# Patient Record
Sex: Female | Born: 1945 | Race: White | Hispanic: No | Smoking: Never smoker
Health system: Southern US, Community
[De-identification: ages and names within clinical notes are randomized; demographics above are authoritative.]

## PROBLEM LIST (undated history)

## (undated) DIAGNOSIS — C4490 Unspecified malignant neoplasm of skin, unspecified: Secondary | ICD-10-CM

## (undated) DIAGNOSIS — N281 Cyst of kidney, acquired: Secondary | ICD-10-CM

## (undated) DIAGNOSIS — G4733 Obstructive sleep apnea (adult) (pediatric): Secondary | ICD-10-CM

## (undated) DIAGNOSIS — R112 Nausea with vomiting, unspecified: Secondary | ICD-10-CM

## (undated) DIAGNOSIS — T8859XA Other complications of anesthesia, initial encounter: Secondary | ICD-10-CM

## (undated) DIAGNOSIS — T4145XA Adverse effect of unspecified anesthetic, initial encounter: Secondary | ICD-10-CM

## (undated) DIAGNOSIS — E039 Hypothyroidism, unspecified: Secondary | ICD-10-CM

## (undated) DIAGNOSIS — I639 Cerebral infarction, unspecified: Secondary | ICD-10-CM

## (undated) DIAGNOSIS — J189 Pneumonia, unspecified organism: Secondary | ICD-10-CM

## (undated) DIAGNOSIS — K219 Gastro-esophageal reflux disease without esophagitis: Secondary | ICD-10-CM

## (undated) DIAGNOSIS — Z9889 Other specified postprocedural states: Secondary | ICD-10-CM

## (undated) DIAGNOSIS — E7800 Pure hypercholesterolemia, unspecified: Secondary | ICD-10-CM

## (undated) DIAGNOSIS — E119 Type 2 diabetes mellitus without complications: Secondary | ICD-10-CM

## (undated) DIAGNOSIS — I10 Essential (primary) hypertension: Secondary | ICD-10-CM

## (undated) HISTORY — PX: OTHER SURGICAL HISTORY: SHX169

## (undated) HISTORY — DX: Essential (primary) hypertension: I10

## (undated) HISTORY — PX: BREAST CYST ASPIRATION: SHX578

## (undated) HISTORY — DX: Pure hypercholesterolemia, unspecified: E78.00

## (undated) HISTORY — DX: Unspecified malignant neoplasm of skin, unspecified: C44.90

## (undated) HISTORY — DX: Obstructive sleep apnea (adult) (pediatric): G47.33

## (undated) HISTORY — DX: Type 2 diabetes mellitus without complications: E11.9

## (undated) HISTORY — PX: BUNIONECTOMY: SHX129

## (undated) HISTORY — DX: Cerebral infarction, unspecified: I63.9

## (undated) HISTORY — DX: Hypothyroidism, unspecified: E03.9

## (undated) HISTORY — PX: ABDOMINAL HYSTERECTOMY: SUR658

## (undated) HISTORY — DX: Gastro-esophageal reflux disease without esophagitis: K21.9

---

## 2012-10-28 ENCOUNTER — Ambulatory Visit: Payer: Medicare Other

## 2014-01-12 DIAGNOSIS — L648 Other androgenic alopecia: Secondary | ICD-10-CM | POA: Diagnosis not present

## 2014-01-12 DIAGNOSIS — L658 Other specified nonscarring hair loss: Secondary | ICD-10-CM | POA: Diagnosis not present

## 2014-01-12 DIAGNOSIS — D485 Neoplasm of uncertain behavior of skin: Secondary | ICD-10-CM | POA: Diagnosis not present

## 2014-01-12 DIAGNOSIS — L650 Telogen effluvium: Secondary | ICD-10-CM | POA: Diagnosis not present

## 2014-01-12 DIAGNOSIS — D225 Melanocytic nevi of trunk: Secondary | ICD-10-CM | POA: Diagnosis not present

## 2014-01-12 DIAGNOSIS — Z85828 Personal history of other malignant neoplasm of skin: Secondary | ICD-10-CM | POA: Diagnosis not present

## 2014-01-25 DIAGNOSIS — I781 Nevus, non-neoplastic: Secondary | ICD-10-CM | POA: Diagnosis not present

## 2014-01-25 DIAGNOSIS — L821 Other seborrheic keratosis: Secondary | ICD-10-CM | POA: Diagnosis not present

## 2014-01-25 DIAGNOSIS — Z85828 Personal history of other malignant neoplasm of skin: Secondary | ICD-10-CM | POA: Diagnosis not present

## 2014-01-25 DIAGNOSIS — D1801 Hemangioma of skin and subcutaneous tissue: Secondary | ICD-10-CM | POA: Diagnosis not present

## 2014-03-03 DIAGNOSIS — K7581 Nonalcoholic steatohepatitis (NASH): Secondary | ICD-10-CM | POA: Diagnosis not present

## 2014-03-03 DIAGNOSIS — E119 Type 2 diabetes mellitus without complications: Secondary | ICD-10-CM | POA: Diagnosis not present

## 2014-03-03 DIAGNOSIS — R5383 Other fatigue: Secondary | ICD-10-CM | POA: Diagnosis not present

## 2014-03-03 DIAGNOSIS — E785 Hyperlipidemia, unspecified: Secondary | ICD-10-CM | POA: Diagnosis not present

## 2014-03-04 DIAGNOSIS — E559 Vitamin D deficiency, unspecified: Secondary | ICD-10-CM | POA: Diagnosis not present

## 2014-03-04 DIAGNOSIS — D649 Anemia, unspecified: Secondary | ICD-10-CM | POA: Diagnosis not present

## 2014-03-04 DIAGNOSIS — M1990 Unspecified osteoarthritis, unspecified site: Secondary | ICD-10-CM | POA: Diagnosis not present

## 2014-03-04 DIAGNOSIS — E230 Hypopituitarism: Secondary | ICD-10-CM | POA: Diagnosis not present

## 2014-04-07 DIAGNOSIS — K219 Gastro-esophageal reflux disease without esophagitis: Secondary | ICD-10-CM | POA: Diagnosis not present

## 2014-04-07 DIAGNOSIS — I10 Essential (primary) hypertension: Secondary | ICD-10-CM | POA: Diagnosis not present

## 2014-04-07 DIAGNOSIS — E559 Vitamin D deficiency, unspecified: Secondary | ICD-10-CM | POA: Diagnosis not present

## 2014-04-07 DIAGNOSIS — E039 Hypothyroidism, unspecified: Secondary | ICD-10-CM | POA: Diagnosis not present

## 2014-04-07 DIAGNOSIS — Z1389 Encounter for screening for other disorder: Secondary | ICD-10-CM | POA: Diagnosis not present

## 2014-04-07 DIAGNOSIS — Z0000 Encounter for general adult medical examination without abnormal findings: Secondary | ICD-10-CM | POA: Diagnosis not present

## 2014-04-07 DIAGNOSIS — E785 Hyperlipidemia, unspecified: Secondary | ICD-10-CM | POA: Diagnosis not present

## 2014-04-07 DIAGNOSIS — R7309 Other abnormal glucose: Secondary | ICD-10-CM | POA: Diagnosis not present

## 2014-07-26 DIAGNOSIS — I781 Nevus, non-neoplastic: Secondary | ICD-10-CM | POA: Diagnosis not present

## 2014-07-26 DIAGNOSIS — L648 Other androgenic alopecia: Secondary | ICD-10-CM | POA: Diagnosis not present

## 2014-07-26 DIAGNOSIS — Z85828 Personal history of other malignant neoplasm of skin: Secondary | ICD-10-CM | POA: Diagnosis not present

## 2014-07-26 DIAGNOSIS — L910 Hypertrophic scar: Secondary | ICD-10-CM | POA: Diagnosis not present

## 2014-08-11 DIAGNOSIS — J209 Acute bronchitis, unspecified: Secondary | ICD-10-CM | POA: Diagnosis not present

## 2014-08-11 DIAGNOSIS — J019 Acute sinusitis, unspecified: Secondary | ICD-10-CM | POA: Diagnosis not present

## 2014-10-20 DIAGNOSIS — K7581 Nonalcoholic steatohepatitis (NASH): Secondary | ICD-10-CM | POA: Diagnosis not present

## 2014-10-20 DIAGNOSIS — Z6838 Body mass index (BMI) 38.0-38.9, adult: Secondary | ICD-10-CM | POA: Diagnosis not present

## 2014-10-20 DIAGNOSIS — E8881 Metabolic syndrome: Secondary | ICD-10-CM | POA: Diagnosis not present

## 2014-10-20 DIAGNOSIS — E559 Vitamin D deficiency, unspecified: Secondary | ICD-10-CM | POA: Diagnosis not present

## 2014-10-20 DIAGNOSIS — E039 Hypothyroidism, unspecified: Secondary | ICD-10-CM | POA: Diagnosis not present

## 2014-10-20 DIAGNOSIS — E669 Obesity, unspecified: Secondary | ICD-10-CM | POA: Diagnosis not present

## 2014-10-20 DIAGNOSIS — I10 Essential (primary) hypertension: Secondary | ICD-10-CM | POA: Diagnosis not present

## 2014-10-20 DIAGNOSIS — Z23 Encounter for immunization: Secondary | ICD-10-CM | POA: Diagnosis not present

## 2014-10-20 DIAGNOSIS — D649 Anemia, unspecified: Secondary | ICD-10-CM | POA: Diagnosis not present

## 2014-10-25 DIAGNOSIS — K7581 Nonalcoholic steatohepatitis (NASH): Secondary | ICD-10-CM | POA: Diagnosis not present

## 2014-10-25 DIAGNOSIS — I10 Essential (primary) hypertension: Secondary | ICD-10-CM | POA: Diagnosis not present

## 2014-10-25 DIAGNOSIS — E559 Vitamin D deficiency, unspecified: Secondary | ICD-10-CM | POA: Diagnosis not present

## 2014-10-25 DIAGNOSIS — E039 Hypothyroidism, unspecified: Secondary | ICD-10-CM | POA: Diagnosis not present

## 2014-10-25 DIAGNOSIS — D649 Anemia, unspecified: Secondary | ICD-10-CM | POA: Diagnosis not present

## 2014-11-10 DIAGNOSIS — J069 Acute upper respiratory infection, unspecified: Secondary | ICD-10-CM | POA: Diagnosis not present

## 2014-11-10 DIAGNOSIS — J019 Acute sinusitis, unspecified: Secondary | ICD-10-CM | POA: Diagnosis not present

## 2015-01-20 DIAGNOSIS — J029 Acute pharyngitis, unspecified: Secondary | ICD-10-CM | POA: Diagnosis not present

## 2015-01-20 DIAGNOSIS — Z760 Encounter for issue of repeat prescription: Secondary | ICD-10-CM | POA: Diagnosis not present

## 2015-01-26 DIAGNOSIS — R05 Cough: Secondary | ICD-10-CM | POA: Diagnosis not present

## 2015-01-26 DIAGNOSIS — H6693 Otitis media, unspecified, bilateral: Secondary | ICD-10-CM | POA: Diagnosis not present

## 2015-01-26 DIAGNOSIS — J018 Other acute sinusitis: Secondary | ICD-10-CM | POA: Diagnosis not present

## 2015-01-26 DIAGNOSIS — H109 Unspecified conjunctivitis: Secondary | ICD-10-CM | POA: Diagnosis not present

## 2015-02-06 DIAGNOSIS — K219 Gastro-esophageal reflux disease without esophagitis: Secondary | ICD-10-CM | POA: Diagnosis not present

## 2015-02-06 DIAGNOSIS — K7581 Nonalcoholic steatohepatitis (NASH): Secondary | ICD-10-CM | POA: Diagnosis not present

## 2015-02-06 DIAGNOSIS — E039 Hypothyroidism, unspecified: Secondary | ICD-10-CM | POA: Diagnosis not present

## 2015-02-06 DIAGNOSIS — I10 Essential (primary) hypertension: Secondary | ICD-10-CM | POA: Diagnosis not present

## 2015-02-06 DIAGNOSIS — R7309 Other abnormal glucose: Secondary | ICD-10-CM | POA: Diagnosis not present

## 2015-02-06 DIAGNOSIS — E785 Hyperlipidemia, unspecified: Secondary | ICD-10-CM | POA: Diagnosis not present

## 2015-02-06 DIAGNOSIS — E559 Vitamin D deficiency, unspecified: Secondary | ICD-10-CM | POA: Diagnosis not present

## 2015-02-13 DIAGNOSIS — R05 Cough: Secondary | ICD-10-CM | POA: Diagnosis not present

## 2015-02-13 DIAGNOSIS — G4730 Sleep apnea, unspecified: Secondary | ICD-10-CM | POA: Diagnosis not present

## 2015-02-13 DIAGNOSIS — J209 Acute bronchitis, unspecified: Secondary | ICD-10-CM | POA: Diagnosis not present

## 2015-02-13 DIAGNOSIS — R0602 Shortness of breath: Secondary | ICD-10-CM | POA: Diagnosis not present

## 2015-02-20 DIAGNOSIS — J209 Acute bronchitis, unspecified: Secondary | ICD-10-CM | POA: Diagnosis not present

## 2015-05-02 DIAGNOSIS — R0602 Shortness of breath: Secondary | ICD-10-CM | POA: Diagnosis not present

## 2015-05-02 DIAGNOSIS — J209 Acute bronchitis, unspecified: Secondary | ICD-10-CM | POA: Diagnosis not present

## 2015-05-09 DIAGNOSIS — K219 Gastro-esophageal reflux disease without esophagitis: Secondary | ICD-10-CM | POA: Diagnosis not present

## 2015-05-30 DIAGNOSIS — K219 Gastro-esophageal reflux disease without esophagitis: Secondary | ICD-10-CM | POA: Diagnosis not present

## 2015-07-14 ENCOUNTER — Ambulatory Visit (INDEPENDENT_AMBULATORY_CARE_PROVIDER_SITE_OTHER): Payer: Medicare Other | Admitting: Pulmonary Disease

## 2015-07-14 ENCOUNTER — Other Ambulatory Visit (INDEPENDENT_AMBULATORY_CARE_PROVIDER_SITE_OTHER): Payer: Medicare Other

## 2015-07-14 ENCOUNTER — Ambulatory Visit (INDEPENDENT_AMBULATORY_CARE_PROVIDER_SITE_OTHER)
Admission: RE | Admit: 2015-07-14 | Discharge: 2015-07-14 | Disposition: A | Discharge status: Home / Self Care | Payer: Medicare Other | Source: Ambulatory Visit | Attending: Pulmonary Disease | Admitting: Pulmonary Disease

## 2015-07-14 ENCOUNTER — Encounter (INDEPENDENT_AMBULATORY_CARE_PROVIDER_SITE_OTHER): Payer: Self-pay

## 2015-07-14 ENCOUNTER — Encounter: Payer: Self-pay | Admitting: Pulmonary Disease

## 2015-07-14 VITALS — BP 138/72 | HR 74 | Ht 59.0 in | Wt 197.4 lb

## 2015-07-14 DIAGNOSIS — R0600 Dyspnea, unspecified: Secondary | ICD-10-CM

## 2015-07-14 DIAGNOSIS — R0602 Shortness of breath: Secondary | ICD-10-CM | POA: Diagnosis not present

## 2015-07-14 LAB — CBC WITH DIFFERENTIAL/PLATELET
BASOS PCT: 0.7 % (ref 0.0–3.0)
Basophils Absolute: 0 10*3/uL (ref 0.0–0.1)
EOS ABS: 0.4 10*3/uL (ref 0.0–0.7)
EOS PCT: 5.6 % — AB (ref 0.0–5.0)
HEMATOCRIT: 40.9 % (ref 36.0–46.0)
HEMOGLOBIN: 13.6 g/dL (ref 12.0–15.0)
LYMPHS PCT: 36.8 % (ref 12.0–46.0)
Lymphs Abs: 2.6 10*3/uL (ref 0.7–4.0)
MCHC: 33.3 g/dL (ref 30.0–36.0)
MCV: 90.7 fl (ref 78.0–100.0)
MONO ABS: 0.5 10*3/uL (ref 0.1–1.0)
Monocytes Relative: 6.5 % (ref 3.0–12.0)
NEUTROS ABS: 3.5 10*3/uL (ref 1.4–7.7)
NEUTROS PCT: 50.4 % (ref 43.0–77.0)
Platelets: 186 10*3/uL (ref 150.0–400.0)
RBC: 4.51 Mil/uL (ref 3.87–5.11)
RDW: 14.4 % (ref 11.5–15.5)
WBC: 7 10*3/uL (ref 4.0–10.5)

## 2015-07-14 NOTE — Patient Instructions (Addendum)
We will order a lung function test, chest x-ray and CBC If these are normal then we will order a stress test We'll see you back in 4 weeks or sooner if needed

## 2015-07-14 NOTE — Assessment & Plan Note (Signed)
I splinted her today that the differential diagnosis of dyspnea is broad. Fortunately for her there is very few objective abnormalities on my assessment. Her lungs are clear, her oxygenation is normal, and her chest x-ray images which I personally reviewed showed no significant evidence of lung disease.  I think that the most likely cause of her dyspnea is going to be obesity as she's gained 60 pounds in the last year. However, she is at increased risk for coronary disease, she's had a history of GI bleeds or anemia is also possible, and she has not formally been assessed for lung disease.  Plan: PFT Chest x-ray CBC A physical normal then we'll get a stress test If all those tests are normal but I'm going to encourage an aggressive weight loss routine with diet and exercise.

## 2015-07-14 NOTE — Progress Notes (Signed)
Subjective:    Patient ID: Jillian Jimenez, female    DOB: [birth year], 70 y.o.   MRN: [id number]  HPI  Chief Complaint  Patient presents with  . Advice Only    self referral. C/o SOB w/ exertion. Denies any wheezing/chest tightness/cough.    H[place]ley had pneumonia back in the winter time.  She was treated with prednisone, breathing treatments, and antibiotics.  She has not really been breathing well since then.   She has primarily exertional dyspnea. It is not worse with lying down.  It is worse when she lies down flat and is worse in the heat.   The dyspnea has actually improved slightly since the pneumonia in the end of January when she had pneumonia.  She has not really tried to exercise since the episode of pneumonia.  She will feel short of breaht with walking on level ground alone a few feet.  She feels like she can't get a deep breath when she is dyspnic.  NO wheeze, cough, pain.  She has had a slight amount of leg swelling.   She notes that she has gained 50-60 pounds in the last year which she attributes to stress.  She notes that she had a GI bleed and was hospitalized.  She had an endoscopy which was non-diagnostic.  This all occurred in 2015, no recurrence since then.  She was on a baby aspirin at the time.    She has never smoked.  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    She denies cough lately.    She struggled with allergies as a child.  She notes some post nasal drip when she goes outside and when she is walking around.    She has no PCP.    Past Medical History  Diagnosis Date  . Hypertension   . Diabetes mellitus ([place])   . High cholesterol   . Skin cancer   . OSA (obstructive sleep apnea)     on CPAP x 15 years  . Hypothyroid   . 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 Acid reflux      Family History  Problem Relation Age of Onset  . Breast cancer Sister   . Breast cancer Maternal Grandmother   . Stomach cancer Mother   .  Stomach cancer Maternal Grandmother   . COPD Sister   . Asthma Sister      Social History   Social History  . Marital Status: Divorced    Spouse Name: N/A  . Number of Children: Y  . Years of Education: N/A   Occupational History  . retired    Social History Main Topics  . Smoking status: Never Smoker   . Smokeless tobacco: Not on file  . Alcohol Use: 0.0 oz/week    0 Standard drinks or equivalent per week     Comment: 1-2 glasses a day  . Drug Use: No  . Sexual Activity: Not on file   Other Topics Concern  . Not on file   Social History Narrative  . No narrative on file     Allergies  Allergen Reactions  . Augmentin [Amoxicillin-Pot Clavulanate]     Joint aches  . Clindamycin Nausea Only  . Clopidogrel Hives  . Latex   . Norvasc [Amlodipine]     rash  . Promethazine     vomiting  . Sulfa Antibiotics     Sick to stomach  .  Cephalexin Rash     No outpatient prescriptions prior to visit.   No facility-administered medications prior to visit.      Review of Systems  Constitutional: Negative for fever and unexpected weight change.  HENT: Negative for congestion, dental problem, ear pain, nosebleeds, postnasal drip, rhinorrhea, sinus pressure, sneezing, sore throat and trouble swallowing.   Eyes: Negative for redness and itching.  Respiratory: Positive for shortness of breath. Negative for cough, chest tightness and wheezing.   Cardiovascular: Negative for palpitations and leg swelling.  Gastrointestinal: Negative for nausea and vomiting.  Genitourinary: Negative for dysuria.  Musculoskeletal: Negative for joint swelling.  Skin: Negative for rash.  Neurological: Negative for headaches.  Hematological: Does not bruise/bleed easily.  Psychiatric/Behavioral: Negative for dysphoric mood. The patient is not nervous/anxious.        Objective:   Physical Exam Filed Vitals:   07/14/15 1437  BP: 138/72  Pulse: 74  Height: 4\' 11"  (1.499 m)  Weight: 197  lb 6.4 oz (89.54 kg)  SpO2: 99%   RA  Gen: obese but well appearing, no acute distress HENT: NCAT, OP clear, neck supple without masses Eyes: PERRL, EOMi Lymph: no cervical lymphadenopathy PULM: CTA B CV: RRR, no mgr, no JVD GI: BS+, soft, nontender, no hsm Derm: no rash or skin breakdown MSK: normal bulk and tone Neuro: A&Ox4, CN II-XII intact, strength 5/5 in all 4 extremities Psyche: normal mood and affect  Chest x-ray from January 2017 images personally reviewed showing normal pulmonary parenchyma without clear abnormality      Assessment & Plan:  Dyspnea I splinted her today that the differential diagnosis of dyspnea is broad. Fortunately for her there is very few objective abnormalities on my assessment. Her lungs are clear, her oxygenation is normal, and her chest x-ray images which I personally reviewed showed no significant evidence of lung disease.  I think that the most likely cause of her dyspnea is going to be obesity as she's gained 60 pounds in the last year. However, she is at increased risk for coronary disease, she's had a history of GI bleeds or anemia is also possible, and she has not formally been assessed for lung disease.  Plan: PFT Chest x-ray CBC A physical normal then we'll get a stress test If all those tests are normal but I'm going to encourage an aggressive weight loss routine with diet and exercise.     Current outpatient prescriptions:  .  benazepril (LOTENSIN) 10 MG tablet, Take 10 mg by mouth daily., Disp: , Rfl:  .  cetirizine (ZYRTEC) 10 MG tablet, Take 10 mg by mouth daily., Disp: , Rfl:  .  lansoprazole (PREVACID) 30 MG capsule, Take 30 mg by mouth daily at 12 noon., Disp: , Rfl:  .  levothyroxine (SYNTHROID, LEVOTHROID) 25 MCG tablet, Take 25 mcg by mouth daily before breakfast., Disp: , Rfl:  .  pioglitazone (ACTOS) 15 MG tablet, Take 15 mg by mouth daily., Disp: , Rfl: '

## 2015-07-25 ENCOUNTER — Ambulatory Visit (HOSPITAL_COMMUNITY)
Admission: RE | Admit: 2015-07-25 | Discharge: 2015-07-25 | Disposition: A | Discharge status: Home / Self Care | Payer: Medicare Other | Source: Ambulatory Visit | Attending: Pulmonary Disease | Admitting: Pulmonary Disease

## 2015-07-25 DIAGNOSIS — R0600 Dyspnea, unspecified: Secondary | ICD-10-CM | POA: Insufficient documentation

## 2015-07-25 LAB — PULMONARY FUNCTION TEST
DL/VA % PRED: 96 %
DL/VA: 3.96 ml/min/mmHg/L
DLCO COR % PRED: 76 %
DLCO UNC % PRED: 76 %
DLCO UNC: 13.45 ml/min/mmHg
DLCO cor: 13.37 ml/min/mmHg
FEF 25-75 POST: 1.75 L/s
FEF 25-75 Pre: 1.83 L/sec
FEF2575-%CHANGE-POST: -4 %
FEF2575-%PRED-POST: 101 %
FEF2575-%Pred-Pre: 106 %
FEV1-%CHANGE-POST: 0 %
FEV1-%PRED-POST: 97 %
FEV1-%Pred-Pre: 97 %
FEV1-PRE: 1.83 L
FEV1-Post: 1.82 L
FEV1FVC-%Change-Post: 0 %
FEV1FVC-%PRED-PRE: 104 %
FEV6-%Change-Post: 1 %
FEV6-%Pred-Post: 96 %
FEV6-%Pred-Pre: 95 %
FEV6-POST: 2.29 L
FEV6-Pre: 2.27 L
FEV6FVC-%PRED-POST: 104 %
FEV6FVC-%Pred-Pre: 104 %
FVC-%CHANGE-POST: 0 %
FVC-%PRED-PRE: 92 %
FVC-%Pred-Post: 92 %
FVC-POST: 2.29 L
FVC-PRE: 2.3 L
POST FEV1/FVC RATIO: 79 %
PRE FEV1/FVC RATIO: 79 %
PRE FEV6/FVC RATIO: 100 %
Post FEV6/FVC ratio: 100 %
RV % PRED: 71 %
RV: 1.35 L
TLC % pred: 89 %
TLC: 3.87 L

## 2015-07-25 MED ORDER — ALBUTEROL SULFATE (2.5 MG/3ML) 0.083% IN NEBU
2.5000 mg | INHALATION_SOLUTION | Freq: Once | RESPIRATORY_TRACT | Status: AC
  Administered 2015-07-25: 2.5 mg via RESPIRATORY_TRACT

## 2015-07-28 NOTE — Progress Notes (Signed)
OK 

## 2015-08-07 DIAGNOSIS — H8109 Meniere's disease, unspecified ear: Secondary | ICD-10-CM | POA: Diagnosis not present

## 2015-08-07 DIAGNOSIS — M25512 Pain in left shoulder: Secondary | ICD-10-CM | POA: Diagnosis not present

## 2015-08-07 DIAGNOSIS — K7581 Nonalcoholic steatohepatitis (NASH): Secondary | ICD-10-CM | POA: Diagnosis not present

## 2015-08-07 DIAGNOSIS — E559 Vitamin D deficiency, unspecified: Secondary | ICD-10-CM | POA: Diagnosis not present

## 2015-08-07 DIAGNOSIS — Z8679 Personal history of other diseases of the circulatory system: Secondary | ICD-10-CM | POA: Diagnosis not present

## 2015-08-07 DIAGNOSIS — G4733 Obstructive sleep apnea (adult) (pediatric): Secondary | ICD-10-CM | POA: Diagnosis not present

## 2015-08-07 DIAGNOSIS — E039 Hypothyroidism, unspecified: Secondary | ICD-10-CM | POA: Diagnosis not present

## 2015-08-07 DIAGNOSIS — K219 Gastro-esophageal reflux disease without esophagitis: Secondary | ICD-10-CM | POA: Diagnosis not present

## 2015-08-07 DIAGNOSIS — Z85828 Personal history of other malignant neoplasm of skin: Secondary | ICD-10-CM | POA: Diagnosis not present

## 2015-08-07 DIAGNOSIS — E119 Type 2 diabetes mellitus without complications: Secondary | ICD-10-CM | POA: Diagnosis not present

## 2015-08-11 ENCOUNTER — Ambulatory Visit: Payer: Federal, State, Local not specified - PPO | Admitting: Adult Health

## 2015-08-18 DIAGNOSIS — M67911 Unspecified disorder of synovium and tendon, right shoulder: Secondary | ICD-10-CM | POA: Diagnosis not present

## 2015-08-22 ENCOUNTER — Encounter: Payer: Self-pay | Admitting: Adult Health

## 2015-08-22 ENCOUNTER — Ambulatory Visit (INDEPENDENT_AMBULATORY_CARE_PROVIDER_SITE_OTHER): Payer: Medicare Other | Admitting: Adult Health

## 2015-08-22 DIAGNOSIS — R0600 Dyspnea, unspecified: Secondary | ICD-10-CM

## 2015-08-22 NOTE — Patient Instructions (Addendum)
Follow up with our office As needed   Follow up with Cardiology as discussed -Call [phone].

## 2015-08-22 NOTE — Progress Notes (Signed)
Subjective:    Patient ID: Jillian Jimenez, female    DOB: August 03, 1945, 70 y.o.   MRN: [id number]  HPI 70 yo female never smoker seen for pulmonary consult 07/14/15 for dyspnea/DOE .   08/22/2015 Follow up : DOE Pt returns for a 1 month follow up . Seen for pulmonary consult last month for DOE for ~6 months . Developed PNA 01/2015 , tx w. Abx. Since then had DOE . She also gained about 50lbs over last year. Felt due to stress.  Chest xray was normal. PFT was normal with FEV1 97%, ratio 79, FVC 92%. No sign BD response. DLCO 76%. CBC was normal . She says she is feeling better. Dyspnea has resolved. Feels she is back to normal .  Denies dyspnea or DOE . She denies chest pain, exertional chest pain, jaw pain, syncope.  She has HTN and DM. We discussed that if dyspnea returns could consider cardiac workup as she has never had an evaluation in past. She does have some risk factors.   Past Medical History:  Diagnosis Date  . Acid reflux   . Diabetes mellitus ([place])   . High cholesterol   . Hypertension   . Hypothyroid   . OSA (obstructive sleep apnea)    on CPAP x 15 years  . Skin cancer   . Stroke [facility])    in eye   Current Outpatient Prescriptions on File Prior to Visit  Medication Sig Dispense Refill  . benazepril (LOTENSIN) 10 MG tablet Take 10 mg by mouth daily.    . cetirizine (ZYRTEC) 10 MG tablet Take 10 mg by mouth daily.    . lansoprazole (PREVACID) 30 MG capsule Take 30 mg by mouth daily at 12 noon.    Marland Kitchen levothyroxine (SYNTHROID, LEVOTHROID) 25 MCG tablet Take 25 mcg by mouth daily before breakfast.    . pioglitazone (ACTOS) 15 MG tablet Take 15 mg by mouth daily.     No current facility-administered medications on file prior to visit.       Review of Systems Constitutional:   No  weight loss, night sweats,  Fevers, chills +, fatigue, or  lassitude.  HEENT:   No headaches,  Difficulty swallowing,  Tooth/dental problems, or  Sore throat,                No sneezing, itching,  ear ache, nasal congestion, post nasal drip,   CV:  No chest pain,  Orthopnea, PND, swelling in lower extremities, anasarca, dizziness, palpitations, syncope.   GI  No heartburn, indigestion, abdominal pain, nausea, vomiting, diarrhea, change in bowel habits, loss of appetite, bloody stools.   Resp:    No chest wall deformity  Skin: no rash or lesions.  GU: no dysuria, change in color of urine, no urgency or frequency.  No flank pain, no hematuria   MS:  No joint pain or swelling.  No decreased range of motion.  No back pain.  Psych:  No change in mood or affect. No depression or anxiety.  No memory loss.         Objective:   Physical Exam  Vitals:   08/22/15 1530  BP: 126/70  Pulse: 69  Temp: 98.1 F (36.7 C)  TempSrc: Oral  SpO2: 96%  Weight: 193 lb (87.5 kg)  Height: 4\' 11"  (1.499 m)    GEN: A/Ox3; pleasant , NAD, elderly , obese    HEENT:  [place]/AT,  EACs-clear, TMs-wnl, NOSE-clear, THROAT-clear, no lesions, no postnasal drip or exudate noted.  NECK:  Supple w/ fair ROM; no JVD; normal carotid impulses w/o bruits; no thyromegaly or nodules palpated; no lymphadenopathy.    RESP  Clear  P & A; w/o, wheezes/ rales/ or rhonchi. no accessory muscle use, no dullness to percussion  CARD:  RRR, no m/r/g  , no peripheral edema, pulses intact, no cyanosis or clubbing.  GI:   Soft & nt; nml bowel sounds; no organomegaly or masses detected.   Musco: Warm bil, no deformities or joint swelling noted.   Neuro: alert, no focal deficits noted.    Skin: Warm, no lesions or rashes  Shirin Echeverry NP-C  [facility]  08/22/2015

## 2015-08-22 NOTE — Assessment & Plan Note (Signed)
Resolved  follow up As needed

## 2015-08-23 DIAGNOSIS — M7542 Impingement syndrome of left shoulder: Secondary | ICD-10-CM | POA: Diagnosis not present

## 2015-08-23 DIAGNOSIS — M25512 Pain in left shoulder: Secondary | ICD-10-CM | POA: Diagnosis not present

## 2015-08-24 ENCOUNTER — Encounter: Payer: Self-pay | Admitting: Adult Health

## 2015-08-25 DIAGNOSIS — M25512 Pain in left shoulder: Secondary | ICD-10-CM | POA: Diagnosis not present

## 2015-08-25 DIAGNOSIS — M7542 Impingement syndrome of left shoulder: Secondary | ICD-10-CM | POA: Diagnosis not present

## 2015-08-28 DIAGNOSIS — M25512 Pain in left shoulder: Secondary | ICD-10-CM | POA: Diagnosis not present

## 2015-08-28 NOTE — Progress Notes (Signed)
Reviewed, I agree with this plan of care 

## 2015-08-31 DIAGNOSIS — M7542 Impingement syndrome of left shoulder: Secondary | ICD-10-CM | POA: Diagnosis not present

## 2015-08-31 DIAGNOSIS — M25512 Pain in left shoulder: Secondary | ICD-10-CM | POA: Diagnosis not present

## 2015-09-05 DIAGNOSIS — M25512 Pain in left shoulder: Secondary | ICD-10-CM | POA: Diagnosis not present

## 2015-09-05 DIAGNOSIS — M7542 Impingement syndrome of left shoulder: Secondary | ICD-10-CM | POA: Diagnosis not present

## 2015-09-07 DIAGNOSIS — M7542 Impingement syndrome of left shoulder: Secondary | ICD-10-CM | POA: Diagnosis not present

## 2015-09-07 DIAGNOSIS — M25512 Pain in left shoulder: Secondary | ICD-10-CM | POA: Diagnosis not present

## 2015-09-12 DIAGNOSIS — Z1239 Encounter for other screening for malignant neoplasm of breast: Secondary | ICD-10-CM | POA: Diagnosis not present

## 2015-09-12 DIAGNOSIS — G4733 Obstructive sleep apnea (adult) (pediatric): Secondary | ICD-10-CM | POA: Diagnosis not present

## 2015-09-12 DIAGNOSIS — M25512 Pain in left shoulder: Secondary | ICD-10-CM | POA: Diagnosis not present

## 2015-09-12 DIAGNOSIS — K7581 Nonalcoholic steatohepatitis (NASH): Secondary | ICD-10-CM | POA: Diagnosis not present

## 2015-09-12 DIAGNOSIS — E559 Vitamin D deficiency, unspecified: Secondary | ICD-10-CM | POA: Diagnosis not present

## 2015-09-12 DIAGNOSIS — M7542 Impingement syndrome of left shoulder: Secondary | ICD-10-CM | POA: Diagnosis not present

## 2015-09-12 DIAGNOSIS — Z23 Encounter for immunization: Secondary | ICD-10-CM | POA: Diagnosis not present

## 2015-09-12 DIAGNOSIS — E039 Hypothyroidism, unspecified: Secondary | ICD-10-CM | POA: Diagnosis not present

## 2015-09-12 DIAGNOSIS — Z8679 Personal history of other diseases of the circulatory system: Secondary | ICD-10-CM | POA: Diagnosis not present

## 2015-09-12 DIAGNOSIS — Z13220 Encounter for screening for lipoid disorders: Secondary | ICD-10-CM | POA: Diagnosis not present

## 2015-09-12 DIAGNOSIS — E119 Type 2 diabetes mellitus without complications: Secondary | ICD-10-CM | POA: Diagnosis not present

## 2015-09-12 DIAGNOSIS — Z0000 Encounter for general adult medical examination without abnormal findings: Secondary | ICD-10-CM | POA: Diagnosis not present

## 2015-09-12 DIAGNOSIS — M8588 Other specified disorders of bone density and structure, other site: Secondary | ICD-10-CM | POA: Diagnosis not present

## 2015-09-12 DIAGNOSIS — K219 Gastro-esophageal reflux disease without esophagitis: Secondary | ICD-10-CM | POA: Diagnosis not present

## 2015-09-14 DIAGNOSIS — M7542 Impingement syndrome of left shoulder: Secondary | ICD-10-CM | POA: Diagnosis not present

## 2015-09-14 DIAGNOSIS — M25512 Pain in left shoulder: Secondary | ICD-10-CM | POA: Diagnosis not present

## 2015-10-02 DIAGNOSIS — M7542 Impingement syndrome of left shoulder: Secondary | ICD-10-CM | POA: Diagnosis not present

## 2015-10-02 DIAGNOSIS — M25512 Pain in left shoulder: Secondary | ICD-10-CM | POA: Diagnosis not present

## 2015-10-12 DIAGNOSIS — Z803 Family history of malignant neoplasm of breast: Secondary | ICD-10-CM | POA: Diagnosis not present

## 2015-10-12 DIAGNOSIS — M25512 Pain in left shoulder: Secondary | ICD-10-CM | POA: Diagnosis not present

## 2015-10-12 DIAGNOSIS — Z01419 Encounter for gynecological examination (general) (routine) without abnormal findings: Secondary | ICD-10-CM | POA: Diagnosis not present

## 2015-10-12 DIAGNOSIS — M7541 Impingement syndrome of right shoulder: Secondary | ICD-10-CM | POA: Diagnosis not present

## 2015-10-13 DIAGNOSIS — M25512 Pain in left shoulder: Secondary | ICD-10-CM | POA: Diagnosis not present

## 2015-10-17 DIAGNOSIS — D485 Neoplasm of uncertain behavior of skin: Secondary | ICD-10-CM | POA: Diagnosis not present

## 2015-10-17 DIAGNOSIS — D0371 Melanoma in situ of right lower limb, including hip: Secondary | ICD-10-CM | POA: Diagnosis not present

## 2015-10-17 DIAGNOSIS — D225 Melanocytic nevi of trunk: Secondary | ICD-10-CM | POA: Diagnosis not present

## 2015-10-17 DIAGNOSIS — D0359 Melanoma in situ of other part of trunk: Secondary | ICD-10-CM | POA: Diagnosis not present

## 2015-10-18 DIAGNOSIS — J069 Acute upper respiratory infection, unspecified: Secondary | ICD-10-CM | POA: Diagnosis not present

## 2015-10-19 ENCOUNTER — Encounter: Payer: Self-pay | Admitting: Genetic Counselor

## 2015-10-19 ENCOUNTER — Telehealth: Payer: Self-pay | Admitting: Genetic Counselor

## 2015-10-19 NOTE — Telephone Encounter (Signed)
Appt scheduled w/Kayla Boggs for 11/16@1pm . Pt aware to arrive 30 mintes early. Demographics verified. Letter mailed to the pt.

## 2015-10-24 DIAGNOSIS — M25512 Pain in left shoulder: Secondary | ICD-10-CM | POA: Diagnosis not present

## 2015-10-24 DIAGNOSIS — M8588 Other specified disorders of bone density and structure, other site: Secondary | ICD-10-CM | POA: Diagnosis not present

## 2015-10-26 DIAGNOSIS — E559 Vitamin D deficiency, unspecified: Secondary | ICD-10-CM | POA: Diagnosis not present

## 2015-10-26 DIAGNOSIS — E039 Hypothyroidism, unspecified: Secondary | ICD-10-CM | POA: Diagnosis not present

## 2015-10-26 DIAGNOSIS — E8881 Metabolic syndrome: Secondary | ICD-10-CM | POA: Diagnosis not present

## 2015-10-26 DIAGNOSIS — K7581 Nonalcoholic steatohepatitis (NASH): Secondary | ICD-10-CM | POA: Diagnosis not present

## 2015-10-26 DIAGNOSIS — Z6838 Body mass index (BMI) 38.0-38.9, adult: Secondary | ICD-10-CM | POA: Diagnosis not present

## 2015-10-26 DIAGNOSIS — E669 Obesity, unspecified: Secondary | ICD-10-CM | POA: Diagnosis not present

## 2015-10-26 DIAGNOSIS — I10 Essential (primary) hypertension: Secondary | ICD-10-CM | POA: Diagnosis not present

## 2015-11-06 DIAGNOSIS — M75112 Incomplete rotator cuff tear or rupture of left shoulder, not specified as traumatic: Secondary | ICD-10-CM | POA: Diagnosis not present

## 2015-11-08 DIAGNOSIS — L905 Scar conditions and fibrosis of skin: Secondary | ICD-10-CM | POA: Diagnosis not present

## 2015-11-08 DIAGNOSIS — C4371 Malignant melanoma of right lower limb, including hip: Secondary | ICD-10-CM | POA: Diagnosis not present

## 2015-11-14 ENCOUNTER — Telehealth: Payer: Self-pay | Admitting: Genetic Counselor

## 2015-11-14 NOTE — Telephone Encounter (Signed)
11/16 Appointments canceled per patient request. Patient had concerns with lab being covered by insurance after speaking with a representative for her insurance company. The patient requested to cancel both appointment

## 2015-11-23 ENCOUNTER — Other Ambulatory Visit: Payer: Federal, State, Local not specified - PPO

## 2015-11-23 ENCOUNTER — Encounter: Payer: Federal, State, Local not specified - PPO | Admitting: Genetic Counselor

## 2015-11-27 DIAGNOSIS — L900 Lichen sclerosus et atrophicus: Secondary | ICD-10-CM | POA: Diagnosis not present

## 2015-11-27 DIAGNOSIS — D0359 Melanoma in situ of other part of trunk: Secondary | ICD-10-CM | POA: Diagnosis not present

## 2015-12-26 DIAGNOSIS — G4700 Insomnia, unspecified: Secondary | ICD-10-CM | POA: Diagnosis not present

## 2015-12-26 DIAGNOSIS — G4733 Obstructive sleep apnea (adult) (pediatric): Secondary | ICD-10-CM | POA: Diagnosis not present

## 2016-02-06 DIAGNOSIS — L821 Other seborrheic keratosis: Secondary | ICD-10-CM | POA: Diagnosis not present

## 2016-02-06 DIAGNOSIS — Z85820 Personal history of malignant melanoma of skin: Secondary | ICD-10-CM | POA: Diagnosis not present

## 2016-02-06 DIAGNOSIS — D1801 Hemangioma of skin and subcutaneous tissue: Secondary | ICD-10-CM | POA: Diagnosis not present

## 2016-02-06 DIAGNOSIS — L814 Other melanin hyperpigmentation: Secondary | ICD-10-CM | POA: Diagnosis not present

## 2016-02-27 DIAGNOSIS — G4700 Insomnia, unspecified: Secondary | ICD-10-CM | POA: Diagnosis not present

## 2016-02-27 DIAGNOSIS — G4733 Obstructive sleep apnea (adult) (pediatric): Secondary | ICD-10-CM | POA: Diagnosis not present

## 2016-03-21 DIAGNOSIS — S82832A Other fracture of upper and lower end of left fibula, initial encounter for closed fracture: Secondary | ICD-10-CM | POA: Diagnosis not present

## 2016-03-21 DIAGNOSIS — W19XXXA Unspecified fall, initial encounter: Secondary | ICD-10-CM | POA: Diagnosis not present

## 2016-03-21 DIAGNOSIS — S82892A Other fracture of left lower leg, initial encounter for closed fracture: Secondary | ICD-10-CM | POA: Diagnosis not present

## 2016-03-21 DIAGNOSIS — S93492A Sprain of other ligament of left ankle, initial encounter: Secondary | ICD-10-CM | POA: Diagnosis not present

## 2016-03-24 ENCOUNTER — Emergency Department (HOSPITAL_COMMUNITY): Payer: Medicare Other

## 2016-03-24 ENCOUNTER — Encounter (HOSPITAL_COMMUNITY): Payer: Self-pay | Admitting: Emergency Medicine

## 2016-03-24 ENCOUNTER — Inpatient Hospital Stay (HOSPITAL_COMMUNITY)
Admission: EM | Admit: 2016-03-24 | Discharge: 2016-03-28 | DRG: 103 | Disposition: A | Discharge status: Home / Self Care | Payer: Medicare Other | Attending: Internal Medicine | Admitting: Internal Medicine

## 2016-03-24 DIAGNOSIS — K219 Gastro-esophageal reflux disease without esophagitis: Secondary | ICD-10-CM | POA: Diagnosis not present

## 2016-03-24 DIAGNOSIS — S299XXA Unspecified injury of thorax, initial encounter: Secondary | ICD-10-CM | POA: Diagnosis not present

## 2016-03-24 DIAGNOSIS — E1169 Type 2 diabetes mellitus with other specified complication: Secondary | ICD-10-CM | POA: Diagnosis not present

## 2016-03-24 DIAGNOSIS — E039 Hypothyroidism, unspecified: Secondary | ICD-10-CM | POA: Diagnosis not present

## 2016-03-24 DIAGNOSIS — Z79899 Other long term (current) drug therapy: Secondary | ICD-10-CM

## 2016-03-24 DIAGNOSIS — E860 Dehydration: Secondary | ICD-10-CM | POA: Diagnosis present

## 2016-03-24 DIAGNOSIS — Z881 Allergy status to other antibiotic agents status: Secondary | ICD-10-CM

## 2016-03-24 DIAGNOSIS — Z803 Family history of malignant neoplasm of breast: Secondary | ICD-10-CM

## 2016-03-24 DIAGNOSIS — Z882 Allergy status to sulfonamides status: Secondary | ICD-10-CM

## 2016-03-24 DIAGNOSIS — Z800 Family history of malignant neoplasm of digestive organs: Secondary | ICD-10-CM

## 2016-03-24 DIAGNOSIS — Z888 Allergy status to other drugs, medicaments and biological substances status: Secondary | ICD-10-CM

## 2016-03-24 DIAGNOSIS — E7800 Pure hypercholesterolemia, unspecified: Secondary | ICD-10-CM | POA: Diagnosis present

## 2016-03-24 DIAGNOSIS — W08XXXA Fall from other furniture, initial encounter: Secondary | ICD-10-CM | POA: Diagnosis present

## 2016-03-24 DIAGNOSIS — I10 Essential (primary) hypertension: Secondary | ICD-10-CM | POA: Diagnosis present

## 2016-03-24 DIAGNOSIS — Z90710 Acquired absence of both cervix and uterus: Secondary | ICD-10-CM

## 2016-03-24 DIAGNOSIS — S82892A Other fracture of left lower leg, initial encounter for closed fracture: Secondary | ICD-10-CM

## 2016-03-24 DIAGNOSIS — S199XXA Unspecified injury of neck, initial encounter: Secondary | ICD-10-CM | POA: Diagnosis not present

## 2016-03-24 DIAGNOSIS — E11649 Type 2 diabetes mellitus with hypoglycemia without coma: Secondary | ICD-10-CM | POA: Diagnosis present

## 2016-03-24 DIAGNOSIS — Z825 Family history of asthma and other chronic lower respiratory diseases: Secondary | ICD-10-CM

## 2016-03-24 DIAGNOSIS — R112 Nausea with vomiting, unspecified: Secondary | ICD-10-CM | POA: Diagnosis not present

## 2016-03-24 DIAGNOSIS — S060X0A Concussion without loss of consciousness, initial encounter: Secondary | ICD-10-CM

## 2016-03-24 DIAGNOSIS — G8911 Acute pain due to trauma: Secondary | ICD-10-CM | POA: Diagnosis not present

## 2016-03-24 DIAGNOSIS — Z8673 Personal history of transient ischemic attack (TIA), and cerebral infarction without residual deficits: Secondary | ICD-10-CM

## 2016-03-24 DIAGNOSIS — F0781 Postconcussional syndrome: Principal | ICD-10-CM | POA: Diagnosis present

## 2016-03-24 DIAGNOSIS — G44319 Acute post-traumatic headache, not intractable: Secondary | ICD-10-CM | POA: Diagnosis present

## 2016-03-24 DIAGNOSIS — Z91040 Latex allergy status: Secondary | ICD-10-CM

## 2016-03-24 DIAGNOSIS — Z886 Allergy status to analgesic agent status: Secondary | ICD-10-CM

## 2016-03-24 DIAGNOSIS — E669 Obesity, unspecified: Secondary | ICD-10-CM | POA: Diagnosis not present

## 2016-03-24 DIAGNOSIS — Y92019 Unspecified place in single-family (private) house as the place of occurrence of the external cause: Secondary | ICD-10-CM

## 2016-03-24 DIAGNOSIS — Z6838 Body mass index (BMI) 38.0-38.9, adult: Secondary | ICD-10-CM

## 2016-03-24 DIAGNOSIS — Z85828 Personal history of other malignant neoplasm of skin: Secondary | ICD-10-CM

## 2016-03-24 DIAGNOSIS — Y93E9 Activity, other interior property and clothing maintenance: Secondary | ICD-10-CM

## 2016-03-24 DIAGNOSIS — K7581 Nonalcoholic steatohepatitis (NASH): Secondary | ICD-10-CM | POA: Diagnosis not present

## 2016-03-24 DIAGNOSIS — Z7984 Long term (current) use of oral hypoglycemic drugs: Secondary | ICD-10-CM

## 2016-03-24 DIAGNOSIS — S0990XA Unspecified injury of head, initial encounter: Secondary | ICD-10-CM | POA: Diagnosis not present

## 2016-03-24 DIAGNOSIS — R51 Headache: Secondary | ICD-10-CM | POA: Diagnosis not present

## 2016-03-24 DIAGNOSIS — G4733 Obstructive sleep apnea (adult) (pediatric): Secondary | ICD-10-CM | POA: Diagnosis present

## 2016-03-24 LAB — BASIC METABOLIC PANEL
Anion gap: 10 (ref 5–15)
BUN: 15 mg/dL (ref 6–20)
CO2: 21 mmol/L — ABNORMAL LOW (ref 22–32)
CREATININE: 0.73 mg/dL (ref 0.44–1.00)
Calcium: 9.6 mg/dL (ref 8.9–10.3)
Chloride: 112 mmol/L — ABNORMAL HIGH (ref 101–111)
GFR calc Af Amer: >60 mL/min (ref >60)
Glucose, Bld: 112 mg/dL — ABNORMAL HIGH (ref 65–99)
POTASSIUM: 3.4 mmol/L — AB (ref 3.5–5.1)
Sodium: 143 mmol/L (ref 135–145)

## 2016-03-24 LAB — CBC
HCT: 41.3 % (ref 36.0–46.0)
Hemoglobin: 13.7 g/dL (ref 12.0–15.0)
MCH: 29.8 pg (ref 26.0–34.0)
MCHC: 33.2 g/dL (ref 30.0–36.0)
MCV: 89.8 fL (ref 78.0–100.0)
PLATELETS: 173 10*3/uL (ref 150–400)
RBC: 4.6 MIL/uL (ref 3.87–5.11)
RDW: 14.3 % (ref 11.5–15.5)
WBC: 7.9 10*3/uL (ref 4.0–10.5)

## 2016-03-24 LAB — URINALYSIS, ROUTINE W REFLEX MICROSCOPIC
BILIRUBIN URINE: NEGATIVE
GLUCOSE, UA: NEGATIVE mg/dL
HGB URINE DIPSTICK: NEGATIVE
Ketones, ur: NEGATIVE mg/dL
Leukocytes, UA: NEGATIVE
Nitrite: NEGATIVE
PROTEIN: NEGATIVE mg/dL
Specific Gravity, Urine: 1.003 — ABNORMAL LOW (ref 1.005–1.030)
pH: 6 (ref 5.0–8.0)

## 2016-03-24 MED ORDER — LACTATED RINGERS IV BOLUS (SEPSIS): 1000.0000 mL | Freq: Once | INTRAVENOUS | Status: DC

## 2016-03-24 MED ORDER — INSULIN ASPART 100 UNIT/ML ~~LOC~~ SOLN: 0.0000 [IU] | Freq: Three times a day (TID) | SUBCUTANEOUS | Status: DC

## 2016-03-24 MED ORDER — SODIUM CHLORIDE 0.9 % IV BOLUS (SEPSIS)
1000.0000 mL | Freq: Once | INTRAVENOUS | Status: AC
  Administered 2016-03-24: 1000 mL via INTRAVENOUS

## 2016-03-24 MED ORDER — METOCLOPRAMIDE HCL 5 MG/ML IJ SOLN
5.0000 mg | Freq: Once | INTRAMUSCULAR | Status: AC
  Administered 2016-03-24: 5 mg via INTRAVENOUS
  Filled 2016-03-24: qty 2

## 2016-03-24 MED ORDER — ACETAMINOPHEN 650 MG RE SUPP: 650.0000 mg | Freq: Four times a day (QID) | RECTAL | Status: DC | PRN

## 2016-03-24 MED ORDER — POTASSIUM CHLORIDE IN NACL 20-0.45 MEQ/L-% IV SOLN
INTRAVENOUS | Status: AC
  Administered 2016-03-25 (×2): via INTRAVENOUS
  Filled 2016-03-24 (×2): qty 1000

## 2016-03-24 MED ORDER — ACETAMINOPHEN 325 MG PO TABS: 650.0000 mg | ORAL_TABLET | Freq: Four times a day (QID) | ORAL | Status: DC | PRN

## 2016-03-24 MED ORDER — ONDANSETRON HCL 4 MG/2ML IJ SOLN
4.0000 mg | Freq: Once | INTRAMUSCULAR | Status: AC
  Administered 2016-03-24: 4 mg via INTRAVENOUS
  Filled 2016-03-24: qty 2

## 2016-03-24 MED ORDER — LEVOTHYROXINE SODIUM 25 MCG PO TABS
25.0000 ug | ORAL_TABLET | Freq: Every day | ORAL | Status: DC
  Administered 2016-03-25 – 2016-03-28 (×4): 25 ug via ORAL
  Filled 2016-03-24 (×4): qty 1

## 2016-03-24 MED ORDER — KCL IN DEXTROSE-NACL 20-5-0.45 MEQ/L-%-% IV SOLN: Freq: Once | INTRAVENOUS | Status: DC

## 2016-03-24 MED ORDER — HYDRALAZINE HCL 20 MG/ML IJ SOLN: 10.0000 mg | INTRAMUSCULAR | Status: DC | PRN

## 2016-03-24 MED ORDER — DIPHENHYDRAMINE HCL 50 MG/ML IJ SOLN
12.5000 mg | Freq: Once | INTRAMUSCULAR | Status: AC
  Administered 2016-03-24: 12.5 mg via INTRAVENOUS
  Filled 2016-03-24: qty 1

## 2016-03-24 NOTE — ED Triage Notes (Addendum)
Per PTAR, patient fell off of a small dresser 3 days ago Patient hit her head on the wall. Patient fractured left ankle (patient seen at ortho). Patient fell Saturday at restaurant while using crutches. Patient states she hit her head again on the floor & the door closed on her head. Patient complaining of headache/nausea starting yesterday. Reports increased symptoms today. Patient has not taken any pain medications. Patient is from home. Denies blood thinner use.

## 2016-03-24 NOTE — ED Notes (Signed)
Bed: EH63 Expected date:  Expected time:  Means of arrival:  Comments: 26f Headache and N/V fall last Thursday

## 2016-03-24 NOTE — ED Provider Notes (Signed)
[facility] DEPT Provider Note   CSN: [id number] Arrival date & time: 03/24/16  1902     History   Chief Complaint Chief Complaint  Patient presents with  . Fall  . Head Injury    HPI Jillian Jimenez is a 71 y.o. female.  HPI    45-yo F with PMHx as below here with nausea, vomiting after injury. Three days ago, pt was standing on a small dresser to clean when she fell backwards. She struck the back of her head on the drywall and rolled her ankle. No LOC but she was "dazed." Since then, she went to [employer] for her ankle which was broken and splinted. She has since had worsening HA, nausea, vomiting, and has been unable to eat/drink. She has also been more emotional than usual and is having difficulty getting around her house. HA is aching, throbbing, and posterior.  Past Medical History:  Diagnosis Date  . Acid reflux   . Diabetes mellitus ([place])   . High cholesterol   . Hypertension   . Hypothyroid   . OSA (obstructive sleep apnea)    on CPAP x 15 years  . Skin cancer   . Stroke [facility])    in eye    Patient Active Problem List   Diagnosis Date Noted  . Post concussion syndrome 03/24/2016  . Diabetes mellitus type 2 in obese ([place]) 03/24/2016  . Essential hypertension 03/24/2016  . Hypothyroidism 03/24/2016  . Dyspnea 07/14/2015    Past Surgical History:  Procedure Laterality Date  . ABDOMINAL HYSTERECTOMY    . BUNIONECTOMY     x2  . CESAREAN SECTION     x2    OB History    No data available       Home Medications    Prior to Admission medications   Medication Sig Start Date End Date Taking? Authorizing Provider  benazepril (LOTENSIN) 10 MG tablet Take 10 mg by mouth daily.   Yes Historical Provider, MD  cetirizine (ZYRTEC) 10 MG tablet Take 10 mg by mouth daily.   Yes Historical Provider, MD  lansoprazole (PREVACID) 30 MG capsule Take 30 mg by mouth daily at 12 noon.   Yes Historical Provider, MD  levothyroxine (SYNTHROID, LEVOTHROID) 25 MCG  tablet Take 25 mcg by mouth daily before breakfast.   Yes Historical Provider, MD  pioglitazone (ACTOS) 15 MG tablet Take 15 mg by mouth daily.   Yes Historical Provider, MD    Family History Family History  Problem Relation Age of Onset  . Breast cancer Sister   . Breast cancer Maternal Grandmother   . Stomach cancer Maternal Grandmother   . Stomach cancer Mother   . COPD Sister   . Asthma Sister     Social History Social History  Substance Use Topics  . Smoking status: Never Smoker  . Smokeless tobacco: Never Used  . Alcohol use 0.0 oz/week     Comment: 1-2 glasses a day     Allergies   Aspirin; Augmentin [amoxicillin-pot clavulanate]; Clindamycin; Clopidogrel; Latex; Norvasc [amlodipine]; Promethazine; Sulfa antibiotics; and Cephalexin   Review of Systems Review of Systems  Constitutional: Positive for fatigue. Negative for fever.  Gastrointestinal: Positive for nausea and vomiting.  Musculoskeletal: Positive for arthralgias, gait problem and neck pain.  Neurological: Positive for weakness and headaches.  All other systems reviewed and are negative.    Physical Exam Updated Vital Signs BP 129/63   Pulse 92   Resp 18   Ht 4\' 11"  (1.499 m)  Wt 190 lb (86.2 kg)   SpO2 94%   BMI 38.38 kg/m   Physical Exam  Constitutional: She is oriented to person, place, and time. She appears well-developed and well-nourished. No distress.  HENT:  Head: Normocephalic and atraumatic.  Eyes: Conjunctivae are normal.  Neck: Neck supple.  Mild bilateral tenderness to palpation along cervical spine. No bruising or deformity.  Cardiovascular: Normal rate, regular rhythm and normal heart sounds.  Exam reveals no friction rub.   No murmur heard. Pulmonary/Chest: Effort normal and breath sounds normal. No respiratory distress. She has no wheezes. She has no rales.  Abdominal: She exhibits no distension.  Musculoskeletal: She exhibits no edema.  Neurological: She is alert and  oriented to person, place, and time. She exhibits normal muscle tone.  Skin: Skin is warm. Capillary refill takes less than 2 seconds.  Psychiatric: She has a normal mood and affect.  Nursing note and vitals reviewed.   Neurological Exam:  Mental Status: Alert and oriented to person, place, and time. Attention and concentration normal. Speech clear. Recent memory is intact. Cranial Nerves: Visual fields grossly intact. EOMI and PERRLA. No nystagmus noted. Facial sensation intact at forehead, maxillary cheek, and chin/mandible bilaterally. No facial asymmetry or weakness. Hearing grossly normal. Uvula is midline, and palate elevates symmetrically. Normal SCM and trapezius strength. Tongue midline without fasciculations. Motor: Muscle strength 5/5 in proximal and distal UE and LE bilaterally. No pronator drift. Muscle tone normal. Reflexes: 2+ and symmetrical in all four extremities.  Sensation: Intact to light touch in upper and lower extremities distally bilaterally.  Gait: Normal without ataxia. Coordination: Normal FTN bilaterally.     ED Treatments / Results  Labs (all labs ordered are listed, but only abnormal results are displayed) Labs Reviewed  BASIC METABOLIC PANEL - Abnormal; Notable for the following:       Result Value   Potassium 3.4 (*)    Chloride 112 (*)    CO2 21 (*)    Glucose, Bld 112 (*)    All other components within normal limits  URINALYSIS, ROUTINE W REFLEX MICROSCOPIC - Abnormal; Notable for the following:    Color, Urine STRAW (*)    Specific Gravity, Urine 1.003 (*)    All other components within normal limits  CBC  BASIC METABOLIC PANEL  CBC    EKG  EKG Interpretation None       Radiology Dg Chest 2 View  Result Date: 03/24/2016 CLINICAL DATA:  Patient fell 3 days ago and fracture of the left ankle. Patient fell on Saturday while using crutches. Struck head again. Headache and nausea starting yesterday. Symptoms increased today. History of  stroke, hypertension, diabetes EXAM: CHEST  2 VIEW COMPARISON:  07/14/2015 FINDINGS: Shallow inspiration. Normal heart size and pulmonary vascularity. No focal airspace disease or consolidation in the lungs. No blunting of costophrenic angles. No pneumothorax. Mediastinal contours appear intact. Degenerative changes in the spine. IMPRESSION: No active cardiopulmonary disease. Electronically Signed   By: Lucienne Capers M.D.   On: 03/24/2016 21:05   Ct Head Wo Contrast  Result Date: 03/24/2016 CLINICAL DATA:  Fall.  Initial encounter. EXAM: CT HEAD WITHOUT CONTRAST CT CERVICAL SPINE WITHOUT CONTRAST TECHNIQUE: Multidetector CT imaging of the head and cervical spine was performed following the standard protocol without intravenous contrast. Multiplanar CT image reconstructions of the cervical spine were also generated. COMPARISON:  None. FINDINGS: CT HEAD FINDINGS Brain: There is no evidence of acute cortical infarct, intracranial hemorrhage, mass, midline shift, or extra-axial fluid  collection. The ventricles and sulci are within normal limits for age. Vascular: No hyperdense vessel or unexpected calcification. Skull: No fracture or focal osseous lesion. Sinuses/Orbits: Visualized paranasal sinuses and mastoid air cells are clear. Orbits are unremarkable. Other: None. CT CERVICAL SPINE FINDINGS Alignment: Mild reversal the normal cervical lordosis. No subluxation. Skull base and vertebrae: No evidence of acute fracture or destructive osseous process. Soft tissues and spinal canal: No prevertebral fluid or swelling. No visible canal hematoma. Disc levels: Mild disc space narrowing and moderate degenerative osteophyte formation from C4-T1. Mild upper cervical facet arthrosis, with facet spurring at C3-4 resulting in moderate right neural foraminal stenosis. Upper chest: Clear lung apices. Other: None. IMPRESSION: 1. Negative head CT. 2. No evidence of acute cervical spine fracture or subluxation. Moderate  spondylosis. Electronically Signed   By: Logan Bores M.D.   On: 03/24/2016 21:01   Ct Cervical Spine Wo Contrast  Result Date: 03/24/2016 CLINICAL DATA:  Fall.  Initial encounter. EXAM: CT HEAD WITHOUT CONTRAST CT CERVICAL SPINE WITHOUT CONTRAST TECHNIQUE: Multidetector CT imaging of the head and cervical spine was performed following the standard protocol without intravenous contrast. Multiplanar CT image reconstructions of the cervical spine were also generated. COMPARISON:  None. FINDINGS: CT HEAD FINDINGS Brain: There is no evidence of acute cortical infarct, intracranial hemorrhage, mass, midline shift, or extra-axial fluid collection. The ventricles and sulci are within normal limits for age. Vascular: No hyperdense vessel or unexpected calcification. Skull: No fracture or focal osseous lesion. Sinuses/Orbits: Visualized paranasal sinuses and mastoid air cells are clear. Orbits are unremarkable. Other: None. CT CERVICAL SPINE FINDINGS Alignment: Mild reversal the normal cervical lordosis. No subluxation. Skull base and vertebrae: No evidence of acute fracture or destructive osseous process. Soft tissues and spinal canal: No prevertebral fluid or swelling. No visible canal hematoma. Disc levels: Mild disc space narrowing and moderate degenerative osteophyte formation from C4-T1. Mild upper cervical facet arthrosis, with facet spurring at C3-4 resulting in moderate right neural foraminal stenosis. Upper chest: Clear lung apices. Other: None. IMPRESSION: 1. Negative head CT. 2. No evidence of acute cervical spine fracture or subluxation. Moderate spondylosis. Electronically Signed   By: Logan Bores M.D.   On: 03/24/2016 21:01    Procedures Procedures (including critical care time)  Medications Ordered in ED Medications  levothyroxine (SYNTHROID, LEVOTHROID) tablet 25 mcg (not administered)  hydrALAZINE (APRESOLINE) injection 10 mg (not administered)  acetaminophen (TYLENOL) tablet 650 mg (not  administered)    Or  acetaminophen (TYLENOL) suppository 650 mg (not administered)  insulin aspart (novoLOG) injection 0-9 Units (not administered)  0.45 % NaCl with KCl 20 mEq / L infusion (not administered)  ondansetron (ZOFRAN) injection 4 mg (4 mg Intravenous Given 03/24/16 2025)  sodium chloride 0.9 % bolus 1,000 mL (0 mLs Intravenous Stopped 03/24/16 2351)  ondansetron (ZOFRAN) injection 4 mg (4 mg Intravenous Given 03/24/16 2212)  metoCLOPramide (REGLAN) injection 5 mg (5 mg Intravenous Given 03/24/16 2215)  diphenhydrAMINE (BENADRYL) injection 12.5 mg (12.5 mg Intravenous Given 03/24/16 2217)     Initial Impression / Assessment and Plan / ED Course  I have reviewed the triage vital signs and the nursing notes.  Pertinent labs & imaging results that were available during my care of the patient were reviewed by me and considered in my medical decision making (see chart for details).    71 yo F here with nausea, vomiting, HA in setting of fall 3 days ago, now with intractable nauseaq and inability to tolerate PO. On arrival,  pt with active dry heaving. I suspect the pt has post-concussive emesis. CT head is negative. Labs show mild dehydration. Following IVF and antiemetics, pt remains unable to tolerate PO. I am concerned for dehydration, also inability to care for herself at home in this state. Will consult Hospitalist for admission.  Final Clinical Impressions(s) / ED Diagnoses   Final diagnoses:  Concussion without loss of consciousness, initial encounter  Intractable vomiting with nausea, unspecified vomiting type    New Prescriptions New Prescriptions   No medications on file     Duffy Bruce, MD 03/25/16 [phone]

## 2016-03-24 NOTE — H&P (Signed)
History and Physical    Jillian Jimenez YYT:[id number] DOB: Jul 17, 1945 DOA: 03/24/2016  PCP: Jillian Heck, MD  Patient coming from: Home.  Chief Complaint: Headache nausea vomiting.  HPI: Jillian Jimenez is a 71 y.o. female with hypertension, hyperlipidemia, hypothyroidism and sleep apnea had a fall 3 days ago while patient was standing on a dresser. Patient slipped and fell and hit the back of her head and also twisted her left ankle. Patient went to Dr. Para March office for the left ankle which had fractured and had splint placed. Later in the evening patient went to dinner with the family were in patient having had a fall and hit her head. Since then patient has been having persistent occipital headache with nausea vomiting. Denies any focal deficits. Denies losing consciousness during both episodes of hitting her head. Denies any incontinence of urine or bowel.   ED Course: Patient was given Zofran and Reglan and Benadryl. CT of the head and neck was unremarkable. Patient's headache improved after Reglan was given. She had mild nausea. Patient admitted for further observation for postconcussion syndrome.  Review of Systems: As per HPI, rest all negative.   Past Medical History:  Diagnosis Date  . Acid reflux   . Diabetes mellitus ([place])   . High cholesterol   . Hypertension   . Hypothyroid   . OSA (obstructive sleep apnea)    on CPAP x 15 years  . Skin cancer   . 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 ABDOMINAL HYSTERECTOMY    . BUNIONECTOMY     x2  . CESAREAN SECTION     x2     reports that she has never smoked. She has never used smokeless tobacco. She reports that she drinks alcohol. She reports that she does not use drugs.  Allergies  Allergen Reactions  . Aspirin     GI BLEED  . Augmentin [Amoxicillin-Pot Clavulanate]     Joint aches  . Clindamycin Nausea Only  . Clopidogrel Hives  . Latex   . Norvasc  [Amlodipine]     rash  . Promethazine     vomiting  . Sulfa Antibiotics     Sick to stomach  . Cephalexin Rash    Family History  Problem Relation Age of Onset  . Breast cancer Sister   . Breast cancer Maternal Grandmother   . Stomach cancer Maternal Grandmother   . Stomach cancer Mother   . COPD Sister   . Asthma Sister     Prior to Admission medications   Medication Sig Start Date End Date Taking? Authorizing Provider  benazepril (LOTENSIN) 10 MG tablet Take 10 mg by mouth daily.   Yes Historical Provider, MD  cetirizine (ZYRTEC) 10 MG tablet Take 10 mg by mouth daily.   Yes Historical Provider, MD  lansoprazole (PREVACID) 30 MG capsule Take 30 mg by mouth daily at 12 noon.   Yes Historical Provider, MD  levothyroxine (SYNTHROID, LEVOTHROID) 25 MCG tablet Take 25 mcg by mouth daily before breakfast.   Yes Historical Provider, MD  pioglitazone (ACTOS) 15 MG tablet Take 15 mg by mouth daily.   Yes Historical Provider, MD    Physical Exam: Vitals:   03/24/16 2230 03/24/16 2300 03/24/16 2330 03/24/16 2340  BP: (!) 128/48 (!) 121/49 129/63 129/63  Pulse: 84 82 92 92  Resp:    18  SpO2: 99% 99% 94% 94%  Weight:  Height:          Constitutional: Moderately built and nourished. Vitals:   03/24/16 2230 03/24/16 2300 03/24/16 2330 03/24/16 2340  BP: (!) 128/48 (!) 121/49 129/63 129/63  Pulse: 84 82 92 92  Resp:    18  SpO2: 99% 99% 94% 94%  Weight:      Height:       Eyes: Anicteric no pallor. ENMT: No discharge from the ears eyes nose and mouth. Neck: No neck rigidity no mass felt. Respiratory: No rhonchi or crepitations. Cardiovascular: S1-S2 heard no murmurs appreciated. Abdomen: Soft nontender bowel sounds present. No guarding or rigidity. Musculoskeletal: No edema. No joint effusion. Skin: No rash. Skin appears warm. Neurologic: Alert awake oriented to time place and person. Moves all extremities. Psychiatric: Appears normal. Normal affect.   Labs on  Admission: I have personally reviewed following labs and imaging studies  CBC:  Recent Labs Lab 03/24/16 2001  WBC 7.9  HGB 13.7  HCT 41.3  MCV 89.8  PLT 767   Basic Metabolic Panel:  Recent Labs Lab 03/24/16 2001  NA 143  K 3.4*  CL 112*  CO2 21*  GLUCOSE 112*  BUN 15  CREATININE 0.73  CALCIUM 9.6   GFR: Estimated Creatinine Clearance: 64.2 mL/min (by C-G formula based on SCr of 0.73 mg/dL). Liver Function Tests: No results for input(s): AST, ALT, ALKPHOS, BILITOT, PROT, ALBUMIN in the last 168 hours. No results for input(s): LIPASE, AMYLASE in the last 168 hours. No results for input(s): AMMONIA in the last 168 hours. Coagulation Profile: No results for input(s): INR, PROTIME in the last 168 hours. Cardiac Enzymes: No results for input(s): CKTOTAL, CKMB, CKMBINDEX, TROPONINI in the last 168 hours. BNP (last 3 results) No results for input(s): PROBNP in the last 8760 hours. HbA1C: No results for input(s): HGBA1C in the last 72 hours. CBG: No results for input(s): GLUCAP in the last 168 hours. Lipid Profile: No results for input(s): CHOL, HDL, LDLCALC, TRIG, CHOLHDL, LDLDIRECT in the last 72 hours. Thyroid Function Tests: No results for input(s): TSH, T4TOTAL, FREET4, T3FREE, THYROIDAB in the last 72 hours. Anemia Panel: No results for input(s): VITAMINB12, FOLATE, FERRITIN, TIBC, IRON, RETICCTPCT in the last 72 hours. Urine analysis:    Component Value Date/Time   COLORURINE STRAW (A) 03/24/2016 2001   APPEARANCEUR CLEAR 03/24/2016 2001   LABSPEC 1.003 (L) 03/24/2016 2001   PHURINE 6.0 03/24/2016 2001   GLUCOSEU NEGATIVE 03/24/2016 2001   HGBUR NEGATIVE 03/24/2016 2001   BILIRUBINUR NEGATIVE 03/24/2016 2001   [place] 03/24/2016 2001   PROTEINUR NEGATIVE 03/24/2016 2001   NITRITE NEGATIVE 03/24/2016 2001   LEUKOCYTESUR NEGATIVE 03/24/2016 2001   Sepsis Labs: @LABRCNTIP (procalcitonin:4,lacticidven:4) )No results found for this or any  previous visit (from the past 240 hour(s)).   Radiological Exams on Admission: Dg Chest 2 View  Result Date: 03/24/2016 CLINICAL DATA:  Patient fell 3 days ago and fracture of the left ankle. Patient fell on Saturday while using crutches. Struck head again. Headache and nausea starting yesterday. Symptoms increased today. History of stroke, hypertension, diabetes EXAM: CHEST  2 VIEW COMPARISON:  07/14/2015 FINDINGS: Shallow inspiration. Normal heart size and pulmonary vascularity. No focal airspace disease or consolidation in the lungs. No blunting of costophrenic angles. No pneumothorax. Mediastinal contours appear intact. Degenerative changes in the spine. IMPRESSION: No active cardiopulmonary disease. Electronically Signed   By: Lucienne Capers M.D.   On: 03/24/2016 21:05   Ct Head Wo Contrast  Result Date: 03/24/2016 CLINICAL  DATA:  Fall.  Initial encounter. EXAM: CT HEAD WITHOUT CONTRAST CT CERVICAL SPINE WITHOUT CONTRAST TECHNIQUE: Multidetector CT imaging of the head and cervical spine was performed following the standard protocol without intravenous contrast. Multiplanar CT image reconstructions of the cervical spine were also generated. COMPARISON:  None. FINDINGS: CT HEAD FINDINGS Brain: There is no evidence of acute cortical infarct, intracranial hemorrhage, mass, midline shift, or extra-axial fluid collection. The ventricles and sulci are within normal limits for age. Vascular: No hyperdense vessel or unexpected calcification. Skull: No fracture or focal osseous lesion. Sinuses/Orbits: Visualized paranasal sinuses and mastoid air cells are clear. Orbits are unremarkable. Other: None. CT CERVICAL SPINE FINDINGS Alignment: Mild reversal the normal cervical lordosis. No subluxation. Skull base and vertebrae: No evidence of acute fracture or destructive osseous process. Soft tissues and spinal canal: No prevertebral fluid or swelling. No visible canal hematoma. Disc levels: Mild disc space  narrowing and moderate degenerative osteophyte formation from C4-T1. Mild upper cervical facet arthrosis, with facet spurring at C3-4 resulting in moderate right neural foraminal stenosis. Upper chest: Clear lung apices. Other: None. IMPRESSION: 1. Negative head CT. 2. No evidence of acute cervical spine fracture or subluxation. Moderate spondylosis. Electronically Signed   By: Logan Bores M.D.   On: 03/24/2016 21:01   Ct Cervical Spine Wo Contrast  Result Date: 03/24/2016 CLINICAL DATA:  Fall.  Initial encounter. EXAM: CT HEAD WITHOUT CONTRAST CT CERVICAL SPINE WITHOUT CONTRAST TECHNIQUE: Multidetector CT imaging of the head and cervical spine was performed following the standard protocol without intravenous contrast. Multiplanar CT image reconstructions of the cervical spine were also generated. COMPARISON:  None. FINDINGS: CT HEAD FINDINGS Brain: There is no evidence of acute cortical infarct, intracranial hemorrhage, mass, midline shift, or extra-axial fluid collection. The ventricles and sulci are within normal limits for age. Vascular: No hyperdense vessel or unexpected calcification. Skull: No fracture or focal osseous lesion. Sinuses/Orbits: Visualized paranasal sinuses and mastoid air cells are clear. Orbits are unremarkable. Other: None. CT CERVICAL SPINE FINDINGS Alignment: Mild reversal the normal cervical lordosis. No subluxation. Skull base and vertebrae: No evidence of acute fracture or destructive osseous process. Soft tissues and spinal canal: No prevertebral fluid or swelling. No visible canal hematoma. Disc levels: Mild disc space narrowing and moderate degenerative osteophyte formation from C4-T1. Mild upper cervical facet arthrosis, with facet spurring at C3-4 resulting in moderate right neural foraminal stenosis. Upper chest: Clear lung apices. Other: None. IMPRESSION: 1. Negative head CT. 2. No evidence of acute cervical spine fracture or subluxation. Moderate spondylosis. Electronically  Signed   By: Logan Bores M.D.   On: 03/24/2016 21:01     Assessment/Plan Principal Problem:   Post concussion syndrome Active Problems:   Diabetes mellitus type 2 in obese [facility])   Essential hypertension   Hypothyroidism    1. Post concussion syndrome - headache improved with Reglan and Benadryl. Still has mild nausea. If symptoms recur may need MRI of the brain. Closely observe. 2. Diabetes mellitus type 2 - hold Actos and keep patient on standing scale coverage while in the hospital. 3. Hypertension - will keep patient on when necessary IV hydralazine for systolic blood pressure more than 160. 4. Hypothyroidism continue Synthroid. 5. History of sleep apnea on CPAP at bedtime. 6. Left ankle fracture in splint.   DVT prophylaxis: SCDs. Code Status: Full code.  Family Communication: Discussed with patient.  Disposition Plan: Home.  Consults called: Physical therapy.  Admission status: Observation.    Rise Patience MD [facility] Pager [phone].  If 7PM-7AM, please contact night-coverage [email or web address] Password [facility]  03/24/2016, 11:46 PM

## 2016-03-24 NOTE — ED Notes (Signed)
Dr. Kakrakandy, hospitalist, at bedside. 

## 2016-03-25 DIAGNOSIS — S82892A Other fracture of left lower leg, initial encounter for closed fracture: Secondary | ICD-10-CM | POA: Diagnosis present

## 2016-03-25 DIAGNOSIS — E7800 Pure hypercholesterolemia, unspecified: Secondary | ICD-10-CM | POA: Diagnosis present

## 2016-03-25 DIAGNOSIS — Z881 Allergy status to other antibiotic agents status: Secondary | ICD-10-CM | POA: Diagnosis not present

## 2016-03-25 DIAGNOSIS — Z888 Allergy status to other drugs, medicaments and biological substances status: Secondary | ICD-10-CM | POA: Diagnosis not present

## 2016-03-25 DIAGNOSIS — Z886 Allergy status to analgesic agent status: Secondary | ICD-10-CM | POA: Diagnosis not present

## 2016-03-25 DIAGNOSIS — R262 Difficulty in walking, not elsewhere classified: Secondary | ICD-10-CM | POA: Diagnosis not present

## 2016-03-25 DIAGNOSIS — E039 Hypothyroidism, unspecified: Secondary | ICD-10-CM

## 2016-03-25 DIAGNOSIS — G4733 Obstructive sleep apnea (adult) (pediatric): Secondary | ICD-10-CM | POA: Diagnosis present

## 2016-03-25 DIAGNOSIS — S060X0A Concussion without loss of consciousness, initial encounter: Secondary | ICD-10-CM | POA: Diagnosis not present

## 2016-03-25 DIAGNOSIS — S060X0D Concussion without loss of consciousness, subsequent encounter: Secondary | ICD-10-CM | POA: Diagnosis not present

## 2016-03-25 DIAGNOSIS — F0781 Postconcussional syndrome: Secondary | ICD-10-CM | POA: Diagnosis not present

## 2016-03-25 DIAGNOSIS — R112 Nausea with vomiting, unspecified: Secondary | ICD-10-CM | POA: Diagnosis present

## 2016-03-25 DIAGNOSIS — Z9181 History of falling: Secondary | ICD-10-CM | POA: Diagnosis not present

## 2016-03-25 DIAGNOSIS — K7581 Nonalcoholic steatohepatitis (NASH): Secondary | ICD-10-CM | POA: Diagnosis not present

## 2016-03-25 DIAGNOSIS — E860 Dehydration: Secondary | ICD-10-CM | POA: Diagnosis present

## 2016-03-25 DIAGNOSIS — Y92019 Unspecified place in single-family (private) house as the place of occurrence of the external cause: Secondary | ICD-10-CM | POA: Diagnosis not present

## 2016-03-25 DIAGNOSIS — K219 Gastro-esophageal reflux disease without esophagitis: Secondary | ICD-10-CM | POA: Diagnosis present

## 2016-03-25 DIAGNOSIS — Z6838 Body mass index (BMI) 38.0-38.9, adult: Secondary | ICD-10-CM | POA: Diagnosis not present

## 2016-03-25 DIAGNOSIS — Z7984 Long term (current) use of oral hypoglycemic drugs: Secondary | ICD-10-CM | POA: Diagnosis not present

## 2016-03-25 DIAGNOSIS — Z882 Allergy status to sulfonamides status: Secondary | ICD-10-CM | POA: Diagnosis not present

## 2016-03-25 DIAGNOSIS — E1169 Type 2 diabetes mellitus with other specified complication: Secondary | ICD-10-CM | POA: Diagnosis not present

## 2016-03-25 DIAGNOSIS — Y93E9 Activity, other interior property and clothing maintenance: Secondary | ICD-10-CM | POA: Diagnosis not present

## 2016-03-25 DIAGNOSIS — Z91040 Latex allergy status: Secondary | ICD-10-CM | POA: Diagnosis not present

## 2016-03-25 DIAGNOSIS — M6281 Muscle weakness (generalized): Secondary | ICD-10-CM | POA: Diagnosis not present

## 2016-03-25 DIAGNOSIS — S92909A Unspecified fracture of unspecified foot, initial encounter for closed fracture: Secondary | ICD-10-CM | POA: Diagnosis not present

## 2016-03-25 DIAGNOSIS — Z8673 Personal history of transient ischemic attack (TIA), and cerebral infarction without residual deficits: Secondary | ICD-10-CM | POA: Diagnosis not present

## 2016-03-25 DIAGNOSIS — W08XXXA Fall from other furniture, initial encounter: Secondary | ICD-10-CM | POA: Diagnosis present

## 2016-03-25 DIAGNOSIS — J3089 Other allergic rhinitis: Secondary | ICD-10-CM | POA: Diagnosis not present

## 2016-03-25 DIAGNOSIS — E11649 Type 2 diabetes mellitus with hypoglycemia without coma: Secondary | ICD-10-CM | POA: Diagnosis present

## 2016-03-25 DIAGNOSIS — Z79899 Other long term (current) drug therapy: Secondary | ICD-10-CM | POA: Diagnosis not present

## 2016-03-25 DIAGNOSIS — G44319 Acute post-traumatic headache, not intractable: Secondary | ICD-10-CM | POA: Diagnosis present

## 2016-03-25 DIAGNOSIS — S82892D Other fracture of left lower leg, subsequent encounter for closed fracture with routine healing: Secondary | ICD-10-CM | POA: Diagnosis not present

## 2016-03-25 DIAGNOSIS — E119 Type 2 diabetes mellitus without complications: Secondary | ICD-10-CM | POA: Diagnosis not present

## 2016-03-25 DIAGNOSIS — E669 Obesity, unspecified: Secondary | ICD-10-CM | POA: Diagnosis not present

## 2016-03-25 DIAGNOSIS — Z85828 Personal history of other malignant neoplasm of skin: Secondary | ICD-10-CM | POA: Diagnosis not present

## 2016-03-25 DIAGNOSIS — I10 Essential (primary) hypertension: Secondary | ICD-10-CM | POA: Diagnosis not present

## 2016-03-25 DIAGNOSIS — Z90710 Acquired absence of both cervix and uterus: Secondary | ICD-10-CM | POA: Diagnosis not present

## 2016-03-25 DIAGNOSIS — R278 Other lack of coordination: Secondary | ICD-10-CM | POA: Diagnosis not present

## 2016-03-25 LAB — CBC
HEMATOCRIT: 37.7 % (ref 36.0–46.0)
HEMOGLOBIN: 12.1 g/dL (ref 12.0–15.0)
MCH: 29.2 pg (ref 26.0–34.0)
MCHC: 32.1 g/dL (ref 30.0–36.0)
MCV: 90.8 fL (ref 78.0–100.0)
Platelets: 152 10*3/uL (ref 150–400)
RBC: 4.15 MIL/uL (ref 3.87–5.11)
RDW: 14.5 % (ref 11.5–15.5)
WBC: 6.2 10*3/uL (ref 4.0–10.5)

## 2016-03-25 LAB — BASIC METABOLIC PANEL
Anion gap: 7 (ref 5–15)
BUN: 13 mg/dL (ref 6–20)
CALCIUM: 8.5 mg/dL — AB (ref 8.9–10.3)
CO2: 24 mmol/L (ref 22–32)
Chloride: 111 mmol/L (ref 101–111)
Creatinine, Ser: 0.76 mg/dL (ref 0.44–1.00)
GLUCOSE: 106 mg/dL — AB (ref 65–99)
POTASSIUM: 4 mmol/L (ref 3.5–5.1)
Sodium: 142 mmol/L (ref 135–145)

## 2016-03-25 LAB — GLUCOSE, CAPILLARY
GLUCOSE-CAPILLARY: 107 mg/dL — AB (ref 65–99)
GLUCOSE-CAPILLARY: 110 mg/dL — AB (ref 65–99)
GLUCOSE-CAPILLARY: 68 mg/dL (ref 65–99)
GLUCOSE-CAPILLARY: 127 mg/dL — AB (ref 65–99)
GLUCOSE-CAPILLARY: 104 mg/dL — AB (ref 65–99)

## 2016-03-25 MED ORDER — ONDANSETRON HCL 4 MG/2ML IJ SOLN
4.0000 mg | Freq: Four times a day (QID) | INTRAMUSCULAR | Status: DC | PRN
  Administered 2016-03-25 – 2016-03-26 (×4): 4 mg via INTRAVENOUS
  Filled 2016-03-25 (×4): qty 2

## 2016-03-25 MED ORDER — BENAZEPRIL HCL 10 MG PO TABS
10.0000 mg | ORAL_TABLET | Freq: Every day | ORAL | Status: DC
  Administered 2016-03-25 – 2016-03-28 (×4): 10 mg via ORAL
  Filled 2016-03-25 (×4): qty 1

## 2016-03-25 MED ORDER — METOCLOPRAMIDE HCL 5 MG/ML IJ SOLN
5.0000 mg | Freq: Four times a day (QID) | INTRAMUSCULAR | Status: DC | PRN
  Administered 2016-03-25 – 2016-03-27 (×2): 5 mg via INTRAVENOUS
  Filled 2016-03-25 (×2): qty 2

## 2016-03-25 MED ORDER — PANTOPRAZOLE SODIUM 40 MG PO TBEC
40.0000 mg | DELAYED_RELEASE_TABLET | Freq: Every day | ORAL | Status: DC
  Administered 2016-03-25 – 2016-03-28 (×4): 40 mg via ORAL
  Filled 2016-03-25 (×4): qty 1

## 2016-03-25 MED ORDER — LORATADINE 10 MG PO TABS
10.0000 mg | ORAL_TABLET | Freq: Every day | ORAL | Status: DC
  Filled 2016-03-25 (×2): qty 1

## 2016-03-25 NOTE — Progress Notes (Addendum)
Hypoglycemic Event  CBG: 68  Treatment: 15 GM carbohydrate snack  Symptoms: Pale and None  Follow-up CBG: Time: CBG Result: 127  Possible Reasons for Event: Inadequate meal intake  Comments/MD notified: [place] notified    Jillian Jimenez

## 2016-03-25 NOTE — Progress Notes (Signed)
CSW received consult to assist with [place] notified.   Kathrin Greathouse, Latanya Presser, MSW Clinical Social Worker [place] [facility] [phone] 03/25/2016

## 2016-03-25 NOTE — Progress Notes (Signed)
PROGRESS NOTE                                                                                                                                                                                                             Patient Demographics:    Jillian Jimenez, is a 71 y.o. female, DOB - 10-29-1945, QPY:[id number]  Admit date - 03/24/2016   Admitting Physician Rise Patience, MD  Outpatient Primary MD for the patient is Gerrit Heck, MD  LOS - 0   Chief Complaint  Patient presents with  . Fall  . Head Injury       Brief Narrative   71 y.o. female with hypertension, hyperlipidemia, hypothyroidism ,with fall 3 days ago, and another fall at restaurant one day after, sustained left ankle fracture, status post splint by Dr. Para March, presents with nausea and vomiting, admitted for postconcussion syndrome.   Subjective:    Jillian Jimenez today has, No headache, No chest pain, No abdominal pain -Reports nausea, but no vomiting since admission  Assessment  & Plan :    Principal Problem:   Post concussion syndrome Active Problems:   Diabetes mellitus type 2 in obese [facility])   Essential hypertension   Hypothyroidism    Post concussion syndrome  - headache improved with Reglan and Benadryl. Still has mild nausea. If symptoms recur may need MRI of the brain. Closely observe.  Diabetes mellitus type 2 - hold Actos and keep patient on standing scale coverage while in the hospital.  Hypertension  - on when necessary IV hydralazine , cont with home meds.   Hypothyroidism  - continue Synthroid.  History of sleep apnea  - on CPAP at bedtime.  Left ankle fracture  - in splint, Posterior be changed by orthopedics this coming Wednesday  Code Status : Full  Family Communication  : None at bedside  Disposition Plan  : Home when stable  Consults  :  none  Procedures  : None  DVT Prophylaxis  :   SCDs   Lab Results  Component  Value Date   PLT 152 03/25/2016    Antibiotics  :   Anti-infectives    None        Objective:   Vitals:   03/24/16 2340 03/25/16 0030 03/25/16 0113 03/25/16 0557  BP:  (!) 115/55 (!) 141/73 (!) 153/64  Pulse:  86 86 81  Resp: 18  16 16   Temp:   98.1 F (36.7 C) 97.9 F (36.6 C)  TempSrc:   Oral Oral  SpO2:  90% 99% 99%  Weight:      Height:        Wt Readings from Last 3 Encounters:  03/24/16 86.2 kg (190 lb)  08/22/15 87.5 kg (193 lb)  07/14/15 89.5 kg (197 lb 6.4 oz)     Intake/Output Summary (Last 24 hours) at 03/25/16 1445 Last data filed at 03/25/16 0853  Gross per 24 hour  Intake             1640 ml  Output                0 ml  Net             1640 ml     Physical Exam  Awake Alert, Oriented X 3 Supple Neck,No JVD. Symmetrical Chest wall movement, Good air movement bilaterally, CTAB RRR,No Gallops,Rubs or new Murmurs, No Parasternal Heave +ve B.Sounds, Abd Soft, No tenderness, No rebound - guarding or rigidity. No Cyanosis, Clubbing or edema, left lower ext in splint.    Data Review:    CBC  Recent Labs Lab 03/24/16 2001 03/25/16 0535  WBC 7.9 6.2  HGB 13.7 12.1  HCT 41.3 37.7  PLT 173 152  MCV 89.8 90.8  MCH 29.8 29.2  MCHC 33.2 32.1  RDW 14.3 14.5    Chemistries   Recent Labs Lab 03/24/16 2001 03/25/16 0535  NA 143 142  K 3.4* 4.0  CL 112* 111  CO2 21* 24  GLUCOSE 112* 106*  BUN 15 13  CREATININE 0.73 0.76  CALCIUM 9.6 8.5*   ------------------------------------------------------------------------------------------------------------------ No results for input(s): CHOL, HDL, LDLCALC, TRIG, CHOLHDL, LDLDIRECT in the last 72 hours.  No results found for: HGBA1C ------------------------------------------------------------------------------------------------------------------ No results for input(s): TSH, T4TOTAL, T3FREE, THYROIDAB in the last 72 hours.  Invalid input(s):  FREET3 ------------------------------------------------------------------------------------------------------------------ No results for input(s): VITAMINB12, FOLATE, FERRITIN, TIBC, IRON, RETICCTPCT in the last 72 hours.  Coagulation profile No results for input(s): INR, PROTIME in the last 168 hours.  No results for input(s): DDIMER in the last 72 hours.  Cardiac Enzymes No results for input(s): CKMB, TROPONINI, MYOGLOBIN in the last 168 hours.  Invalid input(s): CK ------------------------------------------------------------------------------------------------------------------ No results found for: BNP  Inpatient Medications  Scheduled Meds: . insulin aspart  0-9 Units Subcutaneous TID WC  . levothyroxine  25 mcg Oral QAC breakfast   Continuous Infusions: . 0.45 % NaCl with KCl 20 mEq / L 100 mL/hr at 03/25/16 1224   PRN Meds:.acetaminophen **OR** acetaminophen, hydrALAZINE, metoCLOPramide (REGLAN) injection, ondansetron (ZOFRAN) IV  Micro Results No results found for this or any previous visit (from the past 240 hour(s)).  Radiology Reports Dg Chest 2 View  Result Date: 03/24/2016 CLINICAL DATA:  Patient fell 3 days ago and fracture of the left ankle. Patient fell on Saturday while using crutches. Struck head again. Headache and nausea starting yesterday. Symptoms increased today. History of stroke, hypertension, diabetes EXAM: CHEST  2 VIEW COMPARISON:  07/14/2015 FINDINGS: Shallow inspiration. Normal heart size and pulmonary vascularity. No focal airspace disease or consolidation in the lungs. No blunting of costophrenic angles. No pneumothorax. Mediastinal contours appear intact. Degenerative changes in the spine. IMPRESSION: No active cardiopulmonary disease. Electronically Signed   By: Lucienne Capers M.D.   On: 03/24/2016 21:05   Ct Head Wo Contrast  Result Date: 03/24/2016 CLINICAL  DATA:  Fall.  Initial encounter. EXAM: CT HEAD WITHOUT CONTRAST CT CERVICAL SPINE  WITHOUT CONTRAST TECHNIQUE: Multidetector CT imaging of the head and cervical spine was performed following the standard protocol without intravenous contrast. Multiplanar CT image reconstructions of the cervical spine were also generated. COMPARISON:  None. FINDINGS: CT HEAD FINDINGS Brain: There is no evidence of acute cortical infarct, intracranial hemorrhage, mass, midline shift, or extra-axial fluid collection. The ventricles and sulci are within normal limits for age. Vascular: No hyperdense vessel or unexpected calcification. Skull: No fracture or focal osseous lesion. Sinuses/Orbits: Visualized paranasal sinuses and mastoid air cells are clear. Orbits are unremarkable. Other: None. CT CERVICAL SPINE FINDINGS Alignment: Mild reversal the normal cervical lordosis. No subluxation. Skull base and vertebrae: No evidence of acute fracture or destructive osseous process. Soft tissues and spinal canal: No prevertebral fluid or swelling. No visible canal hematoma. Disc levels: Mild disc space narrowing and moderate degenerative osteophyte formation from C4-T1. Mild upper cervical facet arthrosis, with facet spurring at C3-4 resulting in moderate right neural foraminal stenosis. Upper chest: Clear lung apices. Other: None. IMPRESSION: 1. Negative head CT. 2. No evidence of acute cervical spine fracture or subluxation. Moderate spondylosis. Electronically Signed   By: Logan Bores M.D.   On: 03/24/2016 21:01   Ct Cervical Spine Wo Contrast  Result Date: 03/24/2016 CLINICAL DATA:  Fall.  Initial encounter. EXAM: CT HEAD WITHOUT CONTRAST CT CERVICAL SPINE WITHOUT CONTRAST TECHNIQUE: Multidetector CT imaging of the head and cervical spine was performed following the standard protocol without intravenous contrast. Multiplanar CT image reconstructions of the cervical spine were also generated. COMPARISON:  None. FINDINGS: CT HEAD FINDINGS Brain: There is no evidence of acute cortical infarct, intracranial hemorrhage,  mass, midline shift, or extra-axial fluid collection. The ventricles and sulci are within normal limits for age. Vascular: No hyperdense vessel or unexpected calcification. Skull: No fracture or focal osseous lesion. Sinuses/Orbits: Visualized paranasal sinuses and mastoid air cells are clear. Orbits are unremarkable. Other: None. CT CERVICAL SPINE FINDINGS Alignment: Mild reversal the normal cervical lordosis. No subluxation. Skull base and vertebrae: No evidence of acute fracture or destructive osseous process. Soft tissues and spinal canal: No prevertebral fluid or swelling. No visible canal hematoma. Disc levels: Mild disc space narrowing and moderate degenerative osteophyte formation from C4-T1. Mild upper cervical facet arthrosis, with facet spurring at C3-4 resulting in moderate right neural foraminal stenosis. Upper chest: Clear lung apices. Other: None. IMPRESSION: 1. Negative head CT. 2. No evidence of acute cervical spine fracture or subluxation. Moderate spondylosis. Electronically Signed   By: Logan Bores M.D.   On: 03/24/2016 21:01     Denaya Horn M.D on 03/25/2016 at 2:45 PM  Between 7am to 7pm - Pager - [phone]  After 7pm go to [email or web address] - password [facility]  [facility] -  Office  [phone]

## 2016-03-26 LAB — GLUCOSE, CAPILLARY
GLUCOSE-CAPILLARY: 97 mg/dL (ref 65–99)
Glucose-Capillary: 101 mg/dL — ABNORMAL HIGH (ref 65–99)
Glucose-Capillary: 102 mg/dL — ABNORMAL HIGH (ref 65–99)
Glucose-Capillary: 84 mg/dL (ref 65–99)

## 2016-03-26 MED ORDER — HEPARIN SODIUM (PORCINE) 5000 UNIT/ML IJ SOLN
5000.0000 [IU] | Freq: Three times a day (TID) | INTRAMUSCULAR | Status: DC
  Filled 2016-03-26: qty 1

## 2016-03-26 MED ORDER — CETIRIZINE HCL 10 MG PO TABS
10.0000 mg | ORAL_TABLET | Freq: Every day | ORAL | Status: DC
  Administered 2016-03-26 – 2016-03-28 (×3): 10 mg via ORAL
  Filled 2016-03-26 (×3): qty 1

## 2016-03-26 NOTE — Progress Notes (Addendum)
OT Note:  Pt said she doesn't qualify for SNF placement.  Recommend HHOT.  She has grab bars by the commode and will not need 3:1.   Anon [place], [place] [phone] 03/26/2016

## 2016-03-26 NOTE — Clinical Social Work Note (Signed)
Clinical Social Work Assessment  Patient Details  Name: Jillian Jimenez MRN: [id number] Date of Birth: 1945-10-13  Date of referral:  03/26/16               Reason for consult:  Facility Placement                Permission sought to share information with:  Facility Art therapist granted to share information::  Yes, Verbal Permission Granted  Name::        Agency::     Relationship::     Contact Information:     Housing/Transportation Living arrangements for the past 2 months:  Apartment Source of Information:  Patient Patient Interpreter Needed:  None Criminal Activity/Legal Involvement Pertinent to Current Situation/Hospitalization:  No - Comment as needed Significant Relationships:  None Lives with:  Self Do you feel safe going back to the place where you live?  No Need for family participation in patient care:  Yes (Comment)  Care giving concerns:  PT recommends SNF   Social Worker assessment / plan:  LCSWA met with patient at beside, explained reason for intervention. Patient expressed she has been living alone in her apartment. The patient had two falls prior to hospital admission and suffered a concussion. The patient reports has been feeling very weak. The patient required assistance with ambulating in hallway with PT. Patient reports she has never been to rehab at facility. She reports just recently moving to the area and is not familiar with rehab centers. The patient son and daughter in law plan to assist with process.  LCSWA Explained the SNF process and plan to follow up with bed offers.  Plan: Complete PASRR, Provide bed offers, assist with placement.   Employment status:    Insurance information:  Medicare PT Recommendations:  [place] / Referral to community resources:  [place]  Patient/Family's Response to care:  Responding well to care.   Patient/Family's Understanding of and Emotional Response to  Diagnosis, Current Treatment, and Prognosis:  No family at bedside. Patient has been in daily contact with her family and has discussed disposition.   Emotional Assessment Appearance:    Attitude/Demeanor/Rapport:    Affect (typically observed):  Accepting, Calm Orientation:  Oriented to Self, Oriented to Place, Oriented to  Time, Oriented to Situation Alcohol / Substance use:  Not Applicable Psych involvement (Current and /or in the community):  No (Comment)  Discharge Needs  Concerns to be addressed:  Discharge Planning Concerns Readmission within the last 30 days:  No Current discharge risk:  None Barriers to Discharge:  Continued Medical Work up   [employer], LCSW 03/26/2016, 2:28 PM

## 2016-03-26 NOTE — Evaluation (Signed)
Occupational Therapy Evaluation Patient Details Name: Jillian Jimenez MRN: [id number] DOB: 1945/11/14 Today's Date: 03/26/2016    History of Present Illness 71 yo female admitted with post concussion syndrome, multiple falls. Recent L ankle fx sustained after initial fall at home. Hx of HTN, hypothyroidism, sleep apnea, DM   Clinical Impression   Pt was admitted for the above.  She hit her head twice, with 2 falls on the same day. She was independent with adls prior to this.  Pt is NWB due to L ankle fx. She will benefit from continued OT to increase safety and independence with adls. Goals are for min guard level in acute    Follow Up Recommendations  SNF    Equipment Recommendations  3 in 1 bedside commode    Recommendations for Other Services       Precautions / Restrictions Precautions Precautions: Fall Required Braces or Orthoses: Other Brace/Splint Other Brace/Splint: L ankle-short splint Restrictions Weight Bearing Restrictions: Yes LLE Weight Bearing: Non weight bearing      Mobility Bed Mobility               General bed mobility comments: oob in recliner  Transfers Overall transfer level: Needs assistance Equipment used: Rolling walker (2 wheeled) Transfers: Sit to/from Stand Sit to Stand: Min assist         General transfer comment: Assist to stabilize. VCs safety, hand/LE placement.     Balance Overall balance assessment: Needs assistance;History of Falls           Standing balance-Leahy Scale: Poor Standing balance comment: requiring RW and external assist                            ADL Overall ADL's : Needs assistance/impaired Eating/Feeding: Independent   Grooming: Set up;Sitting   Upper Body Bathing: Set up;Sitting   Lower Body Bathing: Minimal assistance;Sit to/from stand   Upper Body Dressing : Set up;Sitting   Lower Body Dressing: Moderate assistance;Sit to/from stand   Toilet Transfer: Minimal  assistance;Stand-pivot;BSC;RW   Toileting- Clothing Manipulation and Hygiene: Maximal assistance;Sit to/from stand         General ADL Comments: used commode. Pt has been using a reacher at home for LB dressing. Recommended she have therapist look at tub to see if a tub bench would work.  She had family assist with stool inside of tub, and this does not sound safe with NWB status     Vision   Additional Comments: pt has a h/o lazy eye.  Pt has dysconjugate gaze; L eye inward.  Pt  Has NO c/o diplopia; tracking WFLs, but L eye inward until far end of range.  No dizziness.       Perception     Praxis      Pertinent Vitals/Pain Pain Assessment: Faces Faces Pain Scale: Hurts little more Pain Location: L ankle with dependency, activity Pain Descriptors / Indicators: Sore;Aching Pain Intervention(s): Limited activity within patient's tolerance;Monitored during session;Repositioned     Hand Dominance     Extremity/Trunk Assessment Upper Extremity Assessment Upper Extremity Assessment: Overall WFL for tasks assessed   Lower Extremity Assessment Lower Extremity Assessment: LLE deficits/detail LLE Deficits / Details: knee ext at least 3/5. splint n place.   Cervical / Trunk Assessment Cervical / Trunk Assessment: Normal   Communication Communication Communication: No difficulties   Cognition Arousal/Alertness: Awake/alert Behavior During Therapy: WFL for tasks assessed/performed Overall Cognitive Status: Within Functional Limits for tasks  assessed                     General Comments       Exercises       Shoulder Instructions      Home Living Family/patient expects to be discharged to:: Skilled nursing facility Living Arrangements: Alone   Type of Home: House Home Access: Stairs to enter Entrance Stairs-Number of Steps: 6 Entrance Stairs-Rails: None Home Layout: One level               Home Equipment: Crutches   Additional Comments: has high tub  at home      Prior Functioning/Environment          Comments: Ind up until falls, L ankle fx. Pt has been barely able to function at home since.         OT Problem List: Impaired balance (sitting and/or standing);Decreased activity tolerance;Decreased knowledge of use of DME or AE      OT Treatment/Interventions: Self-care/ADL training;Therapeutic activities;Balance training;Patient/family education    OT Goals(Current goals can be found in the care plan section) Acute Rehab OT Goals Patient Stated Goal: to regain independence OT Goal Formulation: With patient Time For Goal Achievement: 04/02/16 Potential to Achieve Goals: Good ADL Goals Pt Will Perform Lower Body Dressing: with min assist;with adaptive equipment;sit to/from stand Pt Will Transfer to Toilet: with min guard assist;bedside commode;stand pivot transfer Pt Will Perform Toileting - Clothing Manipulation and hygiene: with min guard assist;sitting/lateral leans;sit to/from stand Additional ADL Goal #1: pt will perform bed mobility at supervision level  OT Frequency: Min 2X/week   Barriers to D/C:            Co-evaluation PT/OT/SLP Co-Evaluation/Treatment: Yes Reason for Co-Treatment: For patient/therapist safety PT goals addressed during session: Mobility/safety with mobility OT goals addressed during session: ADL's and self-care      End of Session Equipment Utilized During Treatment: Gait belt  Activity Tolerance: Patient tolerated treatment well Patient left: in chair;with call bell/phone within reach (no chair alarm box in room; pt will call for A)  OT Visit Diagnosis: Unsteadiness on feet (R26.81)                ADL either performed or assessed with clinical judgement  Time: 0263-7858 OT Time Calculation (min): 21 min Charges:  OT General Charges $OT Visit: 1 Procedure OT Evaluation $OT Eval Low Complexity: 1 Procedure G-Codes:     [place],  OTR/L [phone] 03/26/2016  Jillian Jimenez 03/26/2016, 12:34 PM

## 2016-03-26 NOTE — Care Management Note (Signed)
Case Management Note  Patient Details  Name: Jillian Jimenez MRN: [id number] Date of Birth: 1945/07/05  Subjective/Objective:      Fall with chi, confusion/              Action/Plan:Date:  March 26, 2016 Chart reviewed for concurrent status and case management needs. Will continue to follow patient progress. Discharge Planning: following for needs/ poss. hhc needs for home care.  Refuses to go to snf. Expected discharge date: [id number] [name], BSN, [place], [place]  Expected Discharge Date:                  Expected Discharge Plan:  [name]  In-House Referral:  Clinical Social Work  Discharge planning Services  CM Consult  Post Acute Care Choice:    Choice offered to:     DME Arranged:    DME Agency:     HH Arranged:    [place] Agency:     Status of Service:  In process, will continue to follow  If discussed at Long Length of Stay Meetings, dates discussed:    Additional Comments:  Leeroy Cha, RN 03/26/2016, 10:38 AM

## 2016-03-26 NOTE — Evaluation (Addendum)
Physical Therapy Evaluation Patient Details Name: Jillian Jimenez MRN: [id number] DOB: 1945-05-07 Today's Date: 03/26/2016   History of Present Illness  71 yo female admitted with post concussion syndrome, multiple falls. Recent L ankle fx sustained after initial fall at home. Hx of HTN, hypothyroidism, sleep apnea, DM  Clinical Impression  On eval, pt required Min assist (+2 for safe ambulation) for mobility. She was able to hop ~10 feet with a RW. Pain ~4/10 with activity. Pt presents with general weakness, decreased activity tolerance, and impaired gait and balance. Pt lives alone and does not have anyone that can be with her 24/7. Recommend ST rehab to improve safety and functional mobility prior to returning home alone.  Addendum: If pt does not go to SNF, recommend HHPT, [facility], and 24/7 care initially.     Follow Up Recommendations SNF    Equipment Recommendations  Rolling walker with 5" wheels    Recommendations for Other Services OT consult     Precautions / Restrictions Precautions Precautions: Fall Required Braces or Orthoses: Other Brace/Splint Other Brace/Splint: L ankle-short splint Restrictions Weight Bearing Restrictions: Yes LLE Weight Bearing: Non weight bearing      Mobility  Bed Mobility               General bed mobility comments: oob in recliner  Transfers Overall transfer level: Needs assistance Equipment used: Rolling walker (2 wheeled) Transfers: Sit to/from Stand Sit to Stand: Min assist         General transfer comment: Assist to stabilize. VCs safety, hand/LE placement.   Ambulation/Gait Ambulation/Gait assistance: Min assist;+2 safety/equipment Ambulation Distance (Feet): 10 Feet Assistive device: Rolling walker (2 wheeled)       General Gait Details: VCs safety, technique, adherence to NWB status. Pt fatigues quickly. Followed with recliner. Recliner needed to assist pt back to room due to fatigue.   Stairs             Wheelchair Mobility    Modified Rankin (Stroke Patients Only)       Balance Overall balance assessment: Needs assistance;History of Falls           Standing balance-Leahy Scale: Poor Standing balance comment: requiring RW and external assist                             Pertinent Vitals/Pain Pain Assessment: Faces Faces Pain Scale: Hurts little more Pain Location: L ankle with dependency, activity Pain Descriptors / Indicators: Sore;Aching Pain Intervention(s): Limited activity within patient's tolerance;Repositioned    Home Living Family/patient expects to be discharged to:: Skilled nursing facility Living Arrangements: Alone   Type of Home: House Home Access: Stairs to enter Entrance Stairs-Rails: None Entrance Stairs-Number of Steps: 6 Home Layout: One level Home Equipment: Crutches (knee scooter)      Prior Function           Comments: Ind up until falls, L ankle fx. Pt has been barely able to function at home since.      Hand Dominance        Extremity/Trunk Assessment   Upper Extremity Assessment Upper Extremity Assessment: Defer to OT evaluation    Lower Extremity Assessment Lower Extremity Assessment: LLE deficits/detail LLE Deficits / Details: knee ext at least 3/5. splint n place.    Cervical / Trunk Assessment Cervical / Trunk Assessment: Normal  Communication   Communication: No difficulties  Cognition Arousal/Alertness: Awake/alert Behavior During Therapy: WFL for tasks assessed/performed  Overall Cognitive Status: Within Functional Limits for tasks assessed                      General Comments      Exercises     Assessment/Plan    PT Assessment Patient needs continued PT services  PT Problem List Decreased strength;Decreased mobility;Decreased activity tolerance;Decreased balance;Decreased knowledge of use of DME;Pain       PT Treatment Interventions DME instruction;Gait training;Therapeutic  activities;Therapeutic exercise;Patient/family education;Balance training;Functional mobility training    PT Goals (Current goals can be found in the Care Plan section)  Acute Rehab PT Goals Patient Stated Goal: to regain independence PT Goal Formulation: With patient Time For Goal Achievement: 04/09/16 Potential to Achieve Goals: Good    Frequency Min 3X/week   Barriers to discharge        Co-evaluation               End of Session Equipment Utilized During Treatment: Gait belt Activity Tolerance: Patient limited by fatigue Patient left: in chair;with call bell/phone within reach   PT Visit Diagnosis: Difficulty in walking, not elsewhere classified (R26.2);Muscle weakness (generalized) (M62.81)         Time: 2595-6387 PT Time Calculation (min) (ACUTE ONLY): 27 min   Charges:   PT Evaluation $PT Eval Low Complexity: 1 Procedure     PT G Codes:         Weston Anna, MPT Pager: [phone]

## 2016-03-26 NOTE — Progress Notes (Signed)
Pt states that she will self administer CPAP when ready for bed.  Pt to notify RT if any assistance is required throughout the night.  RT to monitor and assess as needed.

## 2016-03-26 NOTE — Progress Notes (Addendum)
PROGRESS NOTE                                                                                                                                                                                                             Patient Demographics:    Jillian Jimenez, is a 71 y.o. female, DOB - 1945-08-29, ZOX:[id number]  Admit date - 03/24/2016   Admitting Physician Rise Patience, MD  Outpatient Primary MD for the patient is Gerrit Heck, MD  LOS - 1   Chief Complaint  Patient presents with  . Fall  . Head Injury       Brief Narrative   71 y.o. female with hypertension, hyperlipidemia, hypothyroidism ,with fall 3 days ago, and another fall at restaurant one day after, sustained left ankle fracture, status post splint by Dr. Para March, presents with nausea and vomiting, admitted for postconcussion syndrome.   Subjective:    Jillian Jimenez today has, No headache, No chest pain, No abdominal pain -Reports nausea, Try to advance to soft diet, she could only tolerate one bite, then had some nausea, so disc related back to clear liquid.  Assessment  & Plan :    Principal Problem:   Post concussion syndrome Active Problems:   Diabetes mellitus type 2 in obese [facility])   Essential hypertension   Hypothyroidism    Post concussion syndrome  - headache improved with Reglan and Benadryl. Still has  nausea. He tried to advance to soft diet, she could not tolerate, only couple bites, so back to clear liquid diet, continue with when necessary nausea meds.  Diabetes mellitus type 2 - hold Actos and keep patient on standing scale coverage while in the hospital.  Hypertension  - on when necessary IV hydralazine , cont with home meds.   Hypothyroidism  - continue Synthroid.  History of sleep apnea  - on CPAP at bedtime.  Left ankle fracture  - in splint supposed to be changed by orthopedic tomorrow , Discussed with Dr. Kerrie Pleasure is to follow  with Dr. Fredonia Highland in the office later in the week.  Code Status : Full  Family Communication  : None at bedside  Disposition Plan  : SNF versus home in 1-2 days when nausea improves   Consults  :  none  Procedures  : None  DVT Prophylaxis  :  [place] HEPARIN -SCDs   Lab Results  Component Value Date   PLT 152 03/25/2016    Antibiotics  :   Anti-infectives    None        Objective:   Vitals:   03/25/16 2035 03/25/16 2148 03/26/16 0533 03/26/16 1354  BP: 137/64  (!) 125/47 (!) 142/65  Pulse: 63  68 76  Resp: 17 18 18    Temp: 98.1 F (36.7 C)  97.6 F (36.4 C) 98.1 F (36.7 C)  TempSrc: Oral  Oral Oral  SpO2: 99%  96% 100%  Weight:      Height:        Wt Readings from Last 3 Encounters:  03/24/16 86.2 kg (190 lb)  08/22/15 87.5 kg (193 lb)  07/14/15 89.5 kg (197 lb 6.4 oz)     Intake/Output Summary (Last 24 hours) at 03/26/16 1729 Last data filed at 03/26/16 1300  Gross per 24 hour  Intake              480 ml  Output                0 ml  Net              480 ml     Physical Exam  Awake Alert, Oriented X 3 Supple Neck,No JVD. Symmetrical Chest wall movement, Good air movement bilaterally, CTAB RRR,No Gallops,Rubs or new Murmurs, No Parasternal Heave +ve B.Sounds, Abd Soft, No tenderness, No rebound - guarding or rigidity. No Cyanosis, Clubbing or edema, left lower ext in splint.    Data Review:    CBC  Recent Labs Lab 03/24/16 2001 03/25/16 0535  WBC 7.9 6.2  HGB 13.7 12.1  HCT 41.3 37.7  PLT 173 152  MCV 89.8 90.8  MCH 29.8 29.2  MCHC 33.2 32.1  RDW 14.3 14.5    Chemistries   Recent Labs Lab 03/24/16 2001 03/25/16 0535  NA 143 142  K 3.4* 4.0  CL 112* 111  CO2 21* 24  GLUCOSE 112* 106*  BUN 15 13  CREATININE 0.73 0.76  CALCIUM 9.6 8.5*   ------------------------------------------------------------------------------------------------------------------ No results for input(s): CHOL, HDL, LDLCALC, TRIG, CHOLHDL, LDLDIRECT  in the last 72 hours.  No results found for: HGBA1C ------------------------------------------------------------------------------------------------------------------ No results for input(s): TSH, T4TOTAL, T3FREE, THYROIDAB in the last 72 hours.  Invalid input(s): FREET3 ------------------------------------------------------------------------------------------------------------------ No results for input(s): VITAMINB12, FOLATE, FERRITIN, TIBC, IRON, RETICCTPCT in the last 72 hours.  Coagulation profile No results for input(s): INR, PROTIME in the last 168 hours.  No results for input(s): DDIMER in the last 72 hours.  Cardiac Enzymes No results for input(s): CKMB, TROPONINI, MYOGLOBIN in the last 168 hours.  Invalid input(s): CK ------------------------------------------------------------------------------------------------------------------ No results found for: BNP  Inpatient Medications  Scheduled Meds: . benazepril  10 mg Oral Daily  . cetirizine  10 mg Oral Daily  . insulin aspart  0-9 Units Subcutaneous TID WC  . levothyroxine  25 mcg Oral QAC breakfast  . pantoprazole  40 mg Oral Daily   Continuous Infusions:  PRN Meds:.acetaminophen **OR** acetaminophen, hydrALAZINE, metoCLOPramide (REGLAN) injection, ondansetron (ZOFRAN) IV  Micro Results No results found for this or any previous visit (from the past 240 hour(s)).  Radiology Reports Dg Chest 2 View  Result Date: 03/24/2016 CLINICAL DATA:  Patient fell 3 days ago and fracture of the left ankle. Patient fell on Saturday while using crutches. Struck head again. Headache and nausea starting yesterday. Symptoms increased today. History of stroke, hypertension, diabetes  EXAM: CHEST  2 VIEW COMPARISON:  07/14/2015 FINDINGS: Shallow inspiration. Normal heart size and pulmonary vascularity. No focal airspace disease or consolidation in the lungs. No blunting of costophrenic angles. No pneumothorax. Mediastinal contours appear  intact. Degenerative changes in the spine. IMPRESSION: No active cardiopulmonary disease. Electronically Signed   By: Lucienne Capers M.D.   On: 03/24/2016 21:05   Ct Head Wo Contrast  Result Date: 03/24/2016 CLINICAL DATA:  Fall.  Initial encounter. EXAM: CT HEAD WITHOUT CONTRAST CT CERVICAL SPINE WITHOUT CONTRAST TECHNIQUE: Multidetector CT imaging of the head and cervical spine was performed following the standard protocol without intravenous contrast. Multiplanar CT image reconstructions of the cervical spine were also generated. COMPARISON:  None. FINDINGS: CT HEAD FINDINGS Brain: There is no evidence of acute cortical infarct, intracranial hemorrhage, mass, midline shift, or extra-axial fluid collection. The ventricles and sulci are within normal limits for age. Vascular: No hyperdense vessel or unexpected calcification. Skull: No fracture or focal osseous lesion. Sinuses/Orbits: Visualized paranasal sinuses and mastoid air cells are clear. Orbits are unremarkable. Other: None. CT CERVICAL SPINE FINDINGS Alignment: Mild reversal the normal cervical lordosis. No subluxation. Skull base and vertebrae: No evidence of acute fracture or destructive osseous process. Soft tissues and spinal canal: No prevertebral fluid or swelling. No visible canal hematoma. Disc levels: Mild disc space narrowing and moderate degenerative osteophyte formation from C4-T1. Mild upper cervical facet arthrosis, with facet spurring at C3-4 resulting in moderate right neural foraminal stenosis. Upper chest: Clear lung apices. Other: None. IMPRESSION: 1. Negative head CT. 2. No evidence of acute cervical spine fracture or subluxation. Moderate spondylosis. Electronically Signed   By: Logan Bores M.D.   On: 03/24/2016 21:01   Ct Cervical Spine Wo Contrast  Result Date: 03/24/2016 CLINICAL DATA:  Fall.  Initial encounter. EXAM: CT HEAD WITHOUT CONTRAST CT CERVICAL SPINE WITHOUT CONTRAST TECHNIQUE: Multidetector CT imaging of the  head and cervical spine was performed following the standard protocol without intravenous contrast. Multiplanar CT image reconstructions of the cervical spine were also generated. COMPARISON:  None. FINDINGS: CT HEAD FINDINGS Brain: There is no evidence of acute cortical infarct, intracranial hemorrhage, mass, midline shift, or extra-axial fluid collection. The ventricles and sulci are within normal limits for age. Vascular: No hyperdense vessel or unexpected calcification. Skull: No fracture or focal osseous lesion. Sinuses/Orbits: Visualized paranasal sinuses and mastoid air cells are clear. Orbits are unremarkable. Other: None. CT CERVICAL SPINE FINDINGS Alignment: Mild reversal the normal cervical lordosis. No subluxation. Skull base and vertebrae: No evidence of acute fracture or destructive osseous process. Soft tissues and spinal canal: No prevertebral fluid or swelling. No visible canal hematoma. Disc levels: Mild disc space narrowing and moderate degenerative osteophyte formation from C4-T1. Mild upper cervical facet arthrosis, with facet spurring at C3-4 resulting in moderate right neural foraminal stenosis. Upper chest: Clear lung apices. Other: None. IMPRESSION: 1. Negative head CT. 2. No evidence of acute cervical spine fracture or subluxation. Moderate spondylosis. Electronically Signed   By: Logan Bores M.D.   On: 03/24/2016 21:01     Myley Bahner M.D on 03/26/2016 at 5:29 PM  Between 7am to 7pm - Pager - [phone]  After 7pm go to [email or web address] - password [email or web address]  [facility] -  Office  [phone]

## 2016-03-27 DIAGNOSIS — S82892D Other fracture of left lower leg, subsequent encounter for closed fracture with routine healing: Secondary | ICD-10-CM

## 2016-03-27 LAB — GLUCOSE, CAPILLARY
GLUCOSE-CAPILLARY: 80 mg/dL (ref 65–99)
Glucose-Capillary: 102 mg/dL — ABNORMAL HIGH (ref 65–99)
Glucose-Capillary: 89 mg/dL (ref 65–99)

## 2016-03-27 LAB — BASIC METABOLIC PANEL
ANION GAP: 9 (ref 5–15)
BUN: 14 mg/dL (ref 6–20)
CO2: 26 mmol/L (ref 22–32)
Calcium: 9.3 mg/dL (ref 8.9–10.3)
Chloride: 108 mmol/L (ref 101–111)
Creatinine, Ser: 0.98 mg/dL (ref 0.44–1.00)
GFR, EST NON AFRICAN AMERICAN: 58 mL/min — AB (ref >60)
GLUCOSE: 103 mg/dL — AB (ref 65–99)
Potassium: 4 mmol/L (ref 3.5–5.1)
Sodium: 143 mmol/L (ref 135–145)

## 2016-03-27 LAB — CBC
HEMATOCRIT: 38.8 % (ref 36.0–46.0)
HEMOGLOBIN: 12.6 g/dL (ref 12.0–15.0)
MCH: 29.6 pg (ref 26.0–34.0)
MCHC: 32.5 g/dL (ref 30.0–36.0)
MCV: 91.1 fL (ref 78.0–100.0)
Platelets: 168 10*3/uL (ref 150–400)
RBC: 4.26 MIL/uL (ref 3.87–5.11)
RDW: 14.5 % (ref 11.5–15.5)
WBC: 6.4 10*3/uL (ref 4.0–10.5)

## 2016-03-27 MED ORDER — ONDANSETRON HCL 4 MG/2ML IJ SOLN
4.0000 mg | Freq: Four times a day (QID) | INTRAMUSCULAR | Status: DC
  Administered 2016-03-27 – 2016-03-28 (×5): 4 mg via INTRAVENOUS
  Filled 2016-03-27 (×5): qty 2

## 2016-03-27 NOTE — Progress Notes (Signed)
PROGRESS NOTE    Jillian Jimenez   MWN:[id number]  DOB: [birth year]  DOA: 03/24/2016 PCP: Gerrit Heck, MD   Brief Narrative:   51-yo F with PMHx as below here with nausea, vomiting after injury. Three days ago, pt was standing on a small dresser to clean when she fell backwards. She struck the back of her head on the drywall and rolled her ankle. No LOC but she was "dazed." Since then, she went to [facility] for her ankle which was broken and splinted. She has since had worsening HA, nausea, vomiting, and has been unable to eat/drink. She has also been more emotional than usual and is having difficulty getting around her house. HA is aching, throbbing, and posterior. CT of the head and neck was unremarkable.  Subjective: Still quite nauseated and having trouble with solid food.   Assessment & Plan:   Principal Problem:   Post concussion syndrome - occipital headache with nausea vomiting - still not able to appropriate tolerated solid food- change Zofran to QID routine and continue to try solids  Active Problems:   Diabetes mellitus type 2 in obese  - hypoglycemic yesterday- sugars well controlled  L anke fracture - splint was due to be changed today- Dr Waldron Labs called ortho and was recommended to cont splint and f/u with Dr Fredonia Highland in the office later this week.  - I have spoken with Dr Percell Miller today- recommended to place her in a boot (order through ortho tech) and f/u next week.     Essential hypertension - benazepril    Hypothyroidism - synthroid    DVT prophylaxis: Heparin Code Status: full code Family Communication: son Disposition Plan: SNF Consultants:     Procedures:    Antimicrobials:  Anti-infectives    None       Objective: Vitals:   03/26/16 2024 03/26/16 2113 03/27/16 0613 03/27/16 1348  BP: (!) 166/68  138/64 138/65  Pulse: 81  78 73  Resp: 20 18 18 18   Temp: 98.1 F (36.7 C)  98.1 F (36.7 C) 97.8 F (36.6 C)  TempSrc:  Oral  Oral Oral  SpO2: 100%  98% 100%  Weight:      Height:        Intake/Output Summary (Last 24 hours) at 03/27/16 1639 Last data filed at 03/27/16 1342  Gross per 24 hour  Intake              480 ml  Output                0 ml  Net              480 ml   Filed Weights   03/24/16 1925  Weight: 86.2 kg (190 lb)    Examination: General exam: Appears comfortable  HEENT: PERRLA, oral mucosa moist, no sclera icterus or thrush Respiratory system: Clear to auscultation. Respiratory effort normal. Cardiovascular system: S1 & S2 heard, RRR.  No murmurs  Gastrointestinal system: Abdomen soft, non-tender, nondistended. Normal bowel sound. No organomegaly Central nervous system: Alert and oriented. No focal neurological deficits. Extremities: No cyanosis, clubbing or edema- left leg in splint Skin: No rashes or ulcers Psychiatry:  Mood & affect appropriate.     Data Reviewed: I have personally reviewed following labs and imaging studies  CBC:  Recent Labs Lab 03/24/16 2001 03/25/16 0535 03/27/16 0604  WBC 7.9 6.2 6.4  HGB 13.7 12.1 12.6  HCT 41.3 37.7 38.8  MCV 89.8 90.8 91.1  PLT  173 152 505   Basic Metabolic Panel:  Recent Labs Lab 03/24/16 2001 03/25/16 0535 03/27/16 0604  NA 143 142 143  K 3.4* 4.0 4.0  CL 112* 111 108  CO2 21* 24 26  GLUCOSE 112* 106* 103*  BUN 15 13 14   CREATININE 0.73 0.76 0.98  CALCIUM 9.6 8.5* 9.3   GFR: Estimated Creatinine Clearance: 52.4 mL/min (by C-G formula based on SCr of 0.98 mg/dL). Liver Function Tests: No results for input(s): AST, ALT, ALKPHOS, BILITOT, PROT, ALBUMIN in the last 168 hours. No results for input(s): LIPASE, AMYLASE in the last 168 hours. No results for input(s): AMMONIA in the last 168 hours. Coagulation Profile: No results for input(s): INR, PROTIME in the last 168 hours. Cardiac Enzymes: No results for input(s): CKTOTAL, CKMB, CKMBINDEX, TROPONINI in the last 168 hours. BNP (last 3 results) No results  for input(s): PROBNP in the last 8760 hours. HbA1C: No results for input(s): HGBA1C in the last 72 hours. CBG:  Recent Labs Lab 03/26/16 1136 03/26/16 1719 03/26/16 2146 03/27/16 0744 03/27/16 1128  GLUCAP 102* 84 97 102* 80   Lipid Profile: No results for input(s): CHOL, HDL, LDLCALC, TRIG, CHOLHDL, LDLDIRECT in the last 72 hours. Thyroid Function Tests: No results for input(s): TSH, T4TOTAL, FREET4, T3FREE, THYROIDAB in the last 72 hours. Anemia Panel: No results for input(s): VITAMINB12, FOLATE, FERRITIN, TIBC, IRON, RETICCTPCT in the last 72 hours. Urine analysis:    Component Value Date/Time   COLORURINE STRAW (A) 03/24/2016 2001   APPEARANCEUR CLEAR 03/24/2016 2001   LABSPEC 1.003 (L) 03/24/2016 2001   PHURINE 6.0 03/24/2016 2001   GLUCOSEU NEGATIVE 03/24/2016 2001   HGBUR NEGATIVE 03/24/2016 2001   BILIRUBINUR NEGATIVE 03/24/2016 2001   [place] 03/24/2016 2001   PROTEINUR NEGATIVE 03/24/2016 2001   NITRITE NEGATIVE 03/24/2016 2001   LEUKOCYTESUR NEGATIVE 03/24/2016 2001   Sepsis Labs: @LABRCNTIP (procalcitonin:4,lacticidven:4) )No results found for this or any previous visit (from the past 240 hour(s)).       Radiology Studies: No results found.    Scheduled Meds: . benazepril  10 mg Oral Daily  . cetirizine  10 mg Oral Daily  . heparin subcutaneous  5,000 Units Subcutaneous Q8H  . insulin aspart  0-9 Units Subcutaneous TID WC  . levothyroxine  25 mcg Oral QAC breakfast  . ondansetron (ZOFRAN) IV  4 mg Intravenous Q6H  . pantoprazole  40 mg Oral Daily   Continuous Infusions:   LOS: 2 days    Time spent in minutes: 80    Jaskirat Schwieger, MD [facility] Pager: [email or web address] Password [facility] 03/27/2016, 4:39 PM

## 2016-03-27 NOTE — Progress Notes (Signed)
Occupational Therapy Treatment Patient Details Name: Jillian Jimenez MRN: [id number] DOB: 1945-05-15 Today's Date: 03/27/2016    History of present illness 71 yo female admitted with post concussion syndrome, multiple falls. Recent L ankle fx sustained after initial fall at home. Hx of HTN, hypothyroidism, sleep apnea, DM   OT comments  Worked on ADL and AE today.  Follow Up Recommendations  SNF    Equipment Recommendations  None recommended by OT    Recommendations for Other Services      Precautions / Restrictions Precautions Precautions: Fall Required Braces or Orthoses: Other Brace/Splint Other Brace/Splint: L ankle-short splint Restrictions Weight Bearing Restrictions: Yes LLE Weight Bearing: Non weight bearing       Mobility Bed Mobility               General bed mobility comments: oob in recliner  Transfers Overall transfer level: Needs assistance Equipment used: Rolling walker (2 wheeled) Transfers: Sit to/from Stand Sit to Stand: Min assist         General transfer comment: light steadying assistance    Balance                                   ADL               Lower Body Bathing: Minimal assistance;Sit to/from stand       Lower Body Dressing: Minimal assistance;Sit to/from stand                 General ADL Comments: performed bathing and dressing from the chair. Educated on and used Public affairs consultant.  Pt used hospital mesh underwear, so this was difficult to don with reacher. Also, she cannot clean herself well when standing. Educated on weight shifting side to side vs lying down and rolling as needed.  Pt is able to manage clothing in standing while maintaining NWB      Vision                     Perception     Praxis      Cognition   Behavior During Therapy: [facility] for tasks assessed/performed Overall Cognitive Status: Within Functional Limits for tasks assessed                         Exercises     Shoulder Instructions       General Comments      Pertinent Vitals/ Pain       Pain Score: 2  Faces Pain Scale: Hurts little more Pain Location: L ankle with dependency, activity Pain Descriptors / Indicators: Sore;Aching Pain Intervention(s): Limited activity within patient's tolerance;Monitored during session;Premedicated before session;Repositioned  Home Living                                          Prior Functioning/Environment              Frequency           Progress Toward Goals  OT Goals(current goals can now be found in the care plan section)  Progress towards OT goals: Progressing toward goals     Plan      Co-evaluation  End of Session        Activity Tolerance Patient tolerated treatment well   Patient Left in chair;with call bell/phone within reach   Nurse Communication          Time: 5797-2820 OT Time Calculation (min): 24 min  Charges: OT General Charges $OT Visit: 1 Procedure OT Treatments $Self Care/Home Management : 23-37 mins  Jillian Jimenez, OTR/L [phone] 03/27/2016   Jillian Jimenez 03/27/2016, 3:14 PM

## 2016-03-27 NOTE — Progress Notes (Signed)
[id number]/AQVOHC Davis,BSN,RN3,CCM/[phone]/spoke to patient and son concerning differences between snf and hhc/pt would like to go to snf due to the ankle fracture and being in a cast/patient is still receiving IV zofran q6hrs and is not able to take p.o.'s well due to nausea/will alert CSW about SNF wishes.

## 2016-03-27 NOTE — Progress Notes (Signed)
Physical Therapy Treatment Patient Details Name: Jillian Jimenez MRN: [id number] DOB: 1945-01-15 Today's Date: 03/27/2016    History of Present Illness 71 yo female admitted with post concussion syndrome, multiple falls. Recent L ankle fx sustained after initial fall at home. Hx of HTN, hypothyroidism, sleep apnea, DM    PT Comments    Assisted with transfers, gait with RW and TE's.  Instructed on NWB and importance of l LE elevation.  Pt plans to D/C to SNF for ST Rehab.   Follow Up Recommendations  SNF     Equipment Recommendations  Rolling walker with 5" wheels    Recommendations for Other Services       Precautions / Restrictions Precautions Precautions: Fall Required Braces or Orthoses: Other Brace/Splint Other Brace/Splint: L ankle-short splint Restrictions Weight Bearing Restrictions: Yes LLE Weight Bearing: Non weight bearing    Mobility  Bed Mobility               General bed mobility comments: oob in recliner  Transfers Overall transfer level: Needs assistance Equipment used: Rolling walker (2 wheeled) Transfers: Sit to/from Stand Sit to Stand: Min assist         General transfer comment: Assist to stabilize. VCs safety, hand/LE placement.   Ambulation/Gait Ambulation/Gait assistance: Min assist;+2 safety/equipment Ambulation Distance (Feet): 12 Feet Assistive device: Rolling walker (2 wheeled)       General Gait Details: VCs safety, technique, adherence to NWB status. Pt fatigues quickly. Followed with recliner. Recliner needed to assist pt back to room due to fatigue.    Stairs            Wheelchair Mobility    Modified Rankin (Stroke Patients Only)       Balance                                    Cognition Arousal/Alertness: Awake/alert Behavior During Therapy: WFL for tasks assessed/performed Overall Cognitive Status: Within Functional Limits for tasks assessed                      Exercises    L LE TE's  10 reps ABd/ADd 10 reps knee presses 10 reps SLR  10 reps heel slides   General Comments        Pertinent Vitals/Pain Pain Score: 4  Faces Pain Scale: Hurts little more Pain Location: L ankle with dependency, activity Pain Descriptors / Indicators: Sore;Aching Pain Intervention(s): Monitored during session;Repositioned    Home Living                      Prior Function            PT Goals (current goals can now be found in the care plan section) Progress towards PT goals: Progressing toward goals    Frequency    Min 3X/week      PT Plan Current plan remains appropriate    Co-evaluation             End of Session Equipment Utilized During Treatment: Gait belt Activity Tolerance: Patient tolerated treatment well Patient left: in chair;with call bell/phone within reach   PT Visit Diagnosis: Difficulty in walking, not elsewhere classified (R26.2);Muscle weakness (generalized) (M62.81)     Time: 3086-5784 PT Time Calculation (min) (ACUTE ONLY): 24 min  Charges:  $Gait Training: 8-22 mins $Therapeutic Exercise: 8-22 mins  G Codes:       Rica Koyanagi  PTA [facility]  Acute  Rehab Pager      [phone]

## 2016-03-27 NOTE — NC FL2 (Signed)
[place] LEVEL OF CARE SCREENING TOOL     IDENTIFICATION  Patient Name: Jillian Jimenez Birthdate: [birth year] Sex: female Admission Date (Current Location): 03/24/2016  [facility] and [place] Number:  Herbalist and Address:  [facility],  [place] [address] [place]      Provider Number: [id number]  Attending Physician Name and Address:  Debbe Odea, MD  Relative Name and Phone Number:       Current Level of Care: SNF Recommended Level of Care: [place] Prior Approval Number:    Date Approved/Denied:   PASRR Number:    [id number]  Discharge Plan:      Current Diagnoses: Patient Active Problem List   Diagnosis Date Noted  . Post concussion syndrome 03/24/2016  . Diabetes mellitus type 2 in obese ([place]) 03/24/2016  . Essential hypertension 03/24/2016  . Hypothyroidism 03/24/2016  . Dyspnea 07/14/2015    Orientation RESPIRATION BLADDER Height & Weight     Self, Time, Situation, Place  Normal Continent Weight: 190 lb (86.2 kg) Height:  4\' 11"  (149.9 cm)  BEHAVIORAL SYMPTOMS/MOOD NEUROLOGICAL BOWEL NUTRITION STATUS      Continent Diet (See D/C Summary)  AMBULATORY STATUS COMMUNICATION OF NEEDS Skin   Extensive Assist Non-Verbally Normal                       Personal Care Assistance Level of Assistance  Bathing, Feeding, Dressing Bathing Assistance: Limited assistance Feeding assistance: Independent Dressing Assistance: Limited assistance     Functional Limitations Info  Sight, Hearing, Speech Sight Info: Adequate Hearing Info: Adequate Speech Info: Adequate    SPECIAL CARE FACTORS FREQUENCY  PT (By licensed PT), OT (By licensed OT)     PT Frequency: 5  OT Frequency: 5              Contractures Contractures Info: Not present    Additional Factors Info  Code Status, Allergies Code Status Info: Fullcode Allergies Info: Aspirin, Augmentin Amoxicillin-pot Clavulanate, Clindamycin,  Clopidogrel, Latex, Norvasc Amlodipine, Promethazine, Sulfa Antibiotics, Cephalexin           Current Medications (03/27/2016):  This is the current hospital active medication list Current Facility-Administered Medications  Medication Dose Route Frequency Provider Last Rate Last Dose  . acetaminophen (TYLENOL) tablet 650 mg  650 mg Oral Q6H PRN Rise Patience, MD       Or  . acetaminophen (TYLENOL) suppository 650 mg  650 mg Rectal Q6H PRN Rise Patience, MD      . benazepril (LOTENSIN) tablet 10 mg  10 mg Oral Daily Albertine Patricia, MD   10 mg at 03/27/16 1005  . cetirizine (ZYRTEC) tablet 10 mg  10 mg Oral Daily Albertine Patricia, MD   10 mg at 03/27/16 1005  . heparin injection 5,000 Units  5,000 Units Subcutaneous Q8H Dawood S Elgergawy, MD      . hydrALAZINE (APRESOLINE) injection 10 mg  10 mg Intravenous Q4H PRN Rise Patience, MD      . insulin aspart (novoLOG) injection 0-9 Units  0-9 Units Subcutaneous TID WC Rise Patience, MD      . levothyroxine (SYNTHROID, LEVOTHROID) tablet 25 mcg  25 mcg Oral QAC breakfast Rise Patience, MD   25 mcg at 03/27/16 0734  . metoCLOPramide (REGLAN) injection 5 mg  5 mg Intravenous Q6H PRN Albertine Patricia, MD   5 mg at 03/27/16 0729  . ondansetron (ZOFRAN) injection 4 mg  4 mg Intravenous Q6H Debbe Odea, MD   4 mg at 03/27/16 1005  . pantoprazole (PROTONIX) EC tablet 40 mg  40 mg Oral Daily Albertine Patricia, MD   40 mg at 03/26/16 1213     Discharge Medications: Please see discharge summary for a list of discharge medications.  Relevant Imaging Results:  Relevant Lab Results:   Additional Information JNP#[id number]  Lia Hopping, LCSW

## 2016-03-28 DIAGNOSIS — R262 Difficulty in walking, not elsewhere classified: Secondary | ICD-10-CM | POA: Diagnosis not present

## 2016-03-28 DIAGNOSIS — M6281 Muscle weakness (generalized): Secondary | ICD-10-CM | POA: Diagnosis not present

## 2016-03-28 DIAGNOSIS — K7581 Nonalcoholic steatohepatitis (NASH): Secondary | ICD-10-CM

## 2016-03-28 DIAGNOSIS — S82892A Other fracture of left lower leg, initial encounter for closed fracture: Secondary | ICD-10-CM

## 2016-03-28 DIAGNOSIS — E669 Obesity, unspecified: Secondary | ICD-10-CM | POA: Diagnosis not present

## 2016-03-28 DIAGNOSIS — S92909A Unspecified fracture of unspecified foot, initial encounter for closed fracture: Secondary | ICD-10-CM | POA: Diagnosis not present

## 2016-03-28 DIAGNOSIS — Z9181 History of falling: Secondary | ICD-10-CM | POA: Diagnosis not present

## 2016-03-28 DIAGNOSIS — J3089 Other allergic rhinitis: Secondary | ICD-10-CM | POA: Diagnosis not present

## 2016-03-28 DIAGNOSIS — E119 Type 2 diabetes mellitus without complications: Secondary | ICD-10-CM | POA: Diagnosis not present

## 2016-03-28 DIAGNOSIS — F0781 Postconcussional syndrome: Secondary | ICD-10-CM | POA: Diagnosis not present

## 2016-03-28 DIAGNOSIS — I10 Essential (primary) hypertension: Secondary | ICD-10-CM | POA: Diagnosis not present

## 2016-03-28 DIAGNOSIS — E039 Hypothyroidism, unspecified: Secondary | ICD-10-CM | POA: Diagnosis not present

## 2016-03-28 DIAGNOSIS — S060X0D Concussion without loss of consciousness, subsequent encounter: Secondary | ICD-10-CM | POA: Diagnosis not present

## 2016-03-28 DIAGNOSIS — S060X0A Concussion without loss of consciousness, initial encounter: Secondary | ICD-10-CM | POA: Diagnosis not present

## 2016-03-28 DIAGNOSIS — R278 Other lack of coordination: Secondary | ICD-10-CM | POA: Diagnosis not present

## 2016-03-28 DIAGNOSIS — S82832A Other fracture of upper and lower end of left fibula, initial encounter for closed fracture: Secondary | ICD-10-CM | POA: Diagnosis not present

## 2016-03-28 DIAGNOSIS — S82892D Other fracture of left lower leg, subsequent encounter for closed fracture with routine healing: Secondary | ICD-10-CM | POA: Diagnosis not present

## 2016-03-28 DIAGNOSIS — E1169 Type 2 diabetes mellitus with other specified complication: Secondary | ICD-10-CM | POA: Diagnosis not present

## 2016-03-28 LAB — GLUCOSE, CAPILLARY
GLUCOSE-CAPILLARY: 108 mg/dL — AB (ref 65–99)
GLUCOSE-CAPILLARY: 95 mg/dL (ref 65–99)
Glucose-Capillary: 110 mg/dL — ABNORMAL HIGH (ref 65–99)

## 2016-03-28 MED ORDER — ACETAMINOPHEN 325 MG PO TABS: 650.0000 mg | ORAL_TABLET | Freq: Four times a day (QID) | ORAL | Status: AC | PRN

## 2016-03-28 MED ORDER — ONDANSETRON HCL 4 MG PO TABS: 4.0000 mg | ORAL_TABLET | Freq: Four times a day (QID) | ORAL | 0 refills | Status: DC

## 2016-03-28 NOTE — Clinical Social Work Placement (Signed)
   CLINICAL SOCIAL WORK PLACEMENT  NOTE  Date:  03/28/2016  Patient Details  Name: Jillian Jimenez MRN: [id number] Date of Birth: 1945/04/28  Clinical Social Work is seeking post-discharge placement for this patient at the [place] level of care (*CSW will initial, date and re-position this form in  chart as items are completed):  Yes   Patient/family provided with [place] Work Department's list of facilities offering this level of care within the geographic area requested by the patient (or if unable, by the patient's family).  Yes   Patient/family informed of their freedom to choose among providers that offer the needed level of care, that participate in Medicare, Medicaid or managed care program needed by the patient, have an available bed and are willing to accept the patient.  Yes   Patient/family informed of [facility]'s ownership interest in [facility] and [facility], as well as of the fact that they are under no obligation to receive care at these facilities.  PASRR submitted to EDS on       PASRR number received on       Existing PASRR number confirmed on 03/28/16     FL2 transmitted to all facilities in geographic area requested by pt/family on 03/27/16     FL2 transmitted to all facilities within larger geographic area on 03/27/16     Patient informed that his/her managed care company has contracts with or will negotiate with certain facilities, including the following:        Yes   Patient/family informed of bed offers received.  Patient chooses bed at [facility]     Physician recommends and patient chooses bed [facility]    Patient to be transferred to [facility] on 03/28/16.  Patient to be transferred to facility by [facility]     Patient family notified on 03/28/16 of transfer.  Name of family member notified:        PHYSICIAN Please prepare priority discharge summary, including medications, Please sign FL2      Additional Comment:    _______________________________________________ Lia Hopping, LCSW 03/28/2016, 10:50 AM

## 2016-03-28 NOTE — Progress Notes (Signed)
Called report to SNF. Gave report to RN. IV was discontinued. All belongs were given to patient. [facility] here to transport patient.

## 2016-03-28 NOTE — Progress Notes (Signed)
Date: March 28, 2016 Discharge orders review for case management needs.  None found.  Patient is going to snf for rehab due to fractured ankle. [name], BSN, [place], CCM: [phone]

## 2016-03-28 NOTE — Discharge Summary (Signed)
Physician Discharge Summary  Jillian Jimenez ULA:[id number] DOB: 05-Jun-1945 DOA: 03/24/2016  PCP: Gerrit Heck, MD  Admit date: 03/24/2016 Discharge date: 03/28/2016  Admitted From: home Disposition:  SNF   Recommendations for Outpatient Follow-up:  1. Zofran: Change to QID PRN in 4 days 2. CBG TID AC/HS- Resume Actos if sugars are rising   Discharge Condition:  stable   CODE STATUS:  Full code   Diet recommendation:  Diabetic, low sodium, heart healthy Consultations:  Phone discussion with Dr Fredonia Highland    Discharge Diagnoses:  Principal Problem:   Post concussion syndrome Active Problems:   NASH (nonalcoholic steatohepatitis)   Diabetes mellitus type 2 in obese [facility])   Essential hypertension   Hypothyroidism   Closed left ankle fracture    Subjective: Still nauseated but able to tolerate solid food now without vomiting.   Brief Summary: 4-yo F with PMHx as below here with nausea, vomiting after injury. Three days ago, pt was standing on a small dresser to clean when she fell backwards. She struck the back of her head on the drywall and rolled her ankle. No LOC but she was "dazed." Since then, she went to [facility] for her ankle which was broken and splinted. She has since had worsening HA, nausea, vomiting, and has been unable to eat/drink. She has also been more emotional than usual, crying every day and is having difficulty getting around her house. She fell again while using her crutches. HA is aching, throbbing, and posterior. CT of the head and neck was unremarkable. Admitted for post concussion syndrome with intractable vomiting needing IV medications.   Hospital Course:  Principal Problem:   Post concussion syndrome - occipital headache with nausea vomiting -  Continue to have symptoms with PRN Zofran- changed Zofran to QID routine resulting in improvement in oral intake- she can continue to take it this way at the SNF for another 4 day and then  change to PRN to assess if she has improved   Active Problems:   Diabetes mellitus type 2 in obese  - Actos has been on hold - hypoglycemic on 3/20- sugars well controlled subsequently  - cont to hold Actos- can check sugars QID and resume if rising  NASH - PCP following  L anke fracture - splint was due to be changed today- Dr Waldron Labs called ortho and was recommended to cont splint and f/u with Dr Fredonia Highland in the office later this week.  - I have spoken with Dr Percell Miller who has recommended to place her in a boot (order through ortho tech) and f/u next week- discussed with patient    Essential hypertension - benazepril    Hypothyroidism - synthroid    Discharge Instructions  Discharge Instructions    Diet - low sodium heart healthy    Complete by:  As directed    Discharge instructions    Complete by:  As directed    Zofran: Change to QID PRN in 4 days CBG TID AC/HS- Resume Actos if sugars are rising   Increase activity slowly    Complete by:  As directed      Allergies as of 03/28/2016      Reactions   Aspirin    GI BLEED   Augmentin [amoxicillin-pot Clavulanate]    Joint aches   Clindamycin Nausea Only   Clopidogrel Hives   Latex    Norvasc [amlodipine]    rash   Promethazine    vomiting   Sulfa Antibiotics  Sick to stomach   Cephalexin Rash      Medication List    STOP taking these medications   pioglitazone 15 MG tablet Commonly known as:  ACTOS     TAKE these medications   acetaminophen 325 MG tablet Commonly known as:  TYLENOL Take 2 tablets (650 mg total) by mouth every 6 (six) hours as needed for mild pain (or Fever >/= 101).   benazepril 10 MG tablet Commonly known as:  LOTENSIN Take 10 mg by mouth daily.   cetirizine 10 MG tablet Commonly known as:  ZYRTEC Take 10 mg by mouth daily.   lansoprazole 30 MG capsule Commonly known as:  PREVACID Take 30 mg by mouth daily at 12 noon.   levothyroxine 25 MCG tablet Commonly known  as:  SYNTHROID, LEVOTHROID Take 25 mcg by mouth daily before breakfast.   ondansetron 4 MG tablet Commonly known as:  ZOFRAN Take 1 tablet (4 mg total) by mouth 4 (four) times daily. Change to QID PRN in 4 days       Contact information for follow-up providers    MURPHY, TIMOTHY D, MD. Schedule an appointment as soon as possible for a visit in 1 week(s).   Specialty:  Orthopedic Surgery Why:  he would like you to see him in 1 wk Contact information: [address] [address] [phone]            Contact information for after-discharge care    Destination    [facility] Follow up.   Specialty:  [place] information: [address] [place] [phone]                 Allergies  Allergen Reactions  . Aspirin     GI BLEED  . Augmentin [Amoxicillin-Pot Clavulanate]     Joint aches  . Clindamycin Nausea Only  . Clopidogrel Hives  . Latex   . Norvasc [Amlodipine]     rash  . Promethazine     vomiting  . Sulfa Antibiotics     Sick to stomach  . Cephalexin Rash     Procedures/Studies:    Dg Chest 2 View  Result Date: 03/24/2016 CLINICAL DATA:  Patient fell 3 days ago and fracture of the left ankle. Patient fell on Saturday while using crutches. Struck head again. Headache and nausea starting yesterday. Symptoms increased today. History of stroke, hypertension, diabetes EXAM: CHEST  2 VIEW COMPARISON:  07/14/2015 FINDINGS: Shallow inspiration. Normal heart size and pulmonary vascularity. No focal airspace disease or consolidation in the lungs. No blunting of costophrenic angles. No pneumothorax. Mediastinal contours appear intact. Degenerative changes in the spine. IMPRESSION: No active cardiopulmonary disease. Electronically Signed   By: Lucienne Capers M.D.   On: 03/24/2016 21:05   Ct Head Wo Contrast  Result Date: 03/24/2016 CLINICAL DATA:  [place].  Initial encounter.  EXAM: CT HEAD WITHOUT CONTRAST CT CERVICAL SPINE WITHOUT CONTRAST TECHNIQUE: Multidetector CT imaging of the head and cervical spine was performed following the standard protocol without intravenous contrast. Multiplanar CT image reconstructions of the cervical spine were also generated. COMPARISON:  None. FINDINGS: CT HEAD FINDINGS Brain: There is no evidence of acute cortical infarct, intracranial hemorrhage, mass, midline shift, or extra-axial fluid collection. The ventricles and sulci are within normal limits for age. Vascular: No hyperdense vessel or unexpected calcification. Skull: No fracture or focal osseous lesion. Sinuses/Orbits: Visualized paranasal sinuses and mastoid air cells are clear. Orbits are  unremarkable. Other: None. CT CERVICAL SPINE FINDINGS Alignment: Mild reversal the normal cervical lordosis. No subluxation. Skull base and vertebrae: No evidence of acute fracture or destructive osseous process. Soft tissues and spinal canal: No prevertebral fluid or swelling. No visible canal hematoma. Disc levels: Mild disc space narrowing and moderate degenerative osteophyte formation from C4-T1. Mild upper cervical facet arthrosis, with facet spurring at C3-4 resulting in moderate right neural foraminal stenosis. Upper chest: Clear lung apices. Other: None. IMPRESSION: 1. Negative head CT. 2. No evidence of acute cervical spine fracture or subluxation. Moderate spondylosis. Electronically Signed   By: Logan Bores M.D.   On: 03/24/2016 21:01   Ct Cervical Spine Wo Contrast  Result Date: 03/24/2016 CLINICAL DATA:  [place].  Initial encounter. EXAM: CT HEAD WITHOUT CONTRAST CT CERVICAL SPINE WITHOUT CONTRAST TECHNIQUE: Multidetector CT imaging of the head and cervical spine was performed following the standard protocol without intravenous contrast. Multiplanar CT image reconstructions of the cervical spine were also generated. COMPARISON:  None. FINDINGS: CT HEAD FINDINGS Brain: There is no evidence of  acute cortical infarct, intracranial hemorrhage, mass, midline shift, or extra-axial fluid collection. The ventricles and sulci are within normal limits for age. Vascular: No hyperdense vessel or unexpected calcification. Skull: No fracture or focal osseous lesion. Sinuses/Orbits: Visualized paranasal sinuses and mastoid air cells are clear. Orbits are unremarkable. Other: None. CT CERVICAL SPINE FINDINGS Alignment: Mild reversal the normal cervical lordosis. No subluxation. Skull base and vertebrae: No evidence of acute fracture or destructive osseous process. Soft tissues and spinal canal: No prevertebral fluid or swelling. No visible canal hematoma. Disc levels: Mild disc space narrowing and moderate degenerative osteophyte formation from C4-T1. Mild upper cervical facet arthrosis, with facet spurring at C3-4 resulting in moderate right neural foraminal stenosis. Upper chest: Clear lung apices. Other: None. IMPRESSION: 1. Negative head CT. 2. No evidence of acute cervical spine fracture or subluxation. Moderate spondylosis. Electronically Signed   By: Logan Bores M.D.   On: 03/24/2016 21:01       Discharge Exam: Vitals:   03/27/16 2245 03/28/16 0530  BP: 139/66 97/72  Pulse: 70 75  Resp: 18 17  Temp: 98.4 F (36.9 C) 97.8 F (36.6 C)   Vitals:   03/27/16 1348 03/27/16 2240 03/27/16 2245 03/28/16 0530  BP: 138/65  139/66 97/72  Pulse: 73  70 75  Resp: 18 18 18 17   Temp: 97.8 F (36.6 C)  98.4 F (36.9 C) 97.8 F (36.6 C)  TempSrc: Oral  Oral Oral  SpO2: 100%  97% 98%  Weight:      Height:        General: Pt is alert, awake, not in acute distress Cardiovascular: RRR, S1/S2 +, no rubs, no gallops Respiratory: CTA bilaterally, no wheezing, no rhonchi Abdominal: Soft, NT, ND, bowel sounds + Extremities: no edema, no cyanosis    The results of significant diagnostics from this hospitalization (including imaging, microbiology, ancillary and laboratory) are listed below for  reference.     Microbiology: No results found for this or any previous visit (from the past 240 hour(s)).   Labs: BNP (last 3 results) No results for input(s): BNP in the last 8760 hours. Basic Metabolic Panel:  Recent Labs Lab 03/24/16 2001 03/25/16 0535 03/27/16 0604  NA 143 142 143  K 3.4* 4.0 4.0  CL 112* 111 108  CO2 21* 24 26  GLUCOSE 112* 106* 103*  BUN 15 13 14   CREATININE 0.73 0.76 0.98  CALCIUM 9.6 8.5* 9.3  Liver Function Tests: No results for input(s): AST, ALT, ALKPHOS, BILITOT, PROT, ALBUMIN in the last 168 hours. No results for input(s): LIPASE, AMYLASE in the last 168 hours. No results for input(s): AMMONIA in the last 168 hours. CBC:  Recent Labs Lab 03/24/16 2001 03/25/16 0535 03/27/16 0604  WBC 7.9 6.2 6.4  HGB 13.7 12.1 12.6  HCT 41.3 37.7 38.8  MCV 89.8 90.8 91.1  PLT 173 152 168   Cardiac Enzymes: No results for input(s): CKTOTAL, CKMB, CKMBINDEX, TROPONINI in the last 168 hours. BNP: Invalid input(s): POCBNP CBG:  Recent Labs Lab 03/27/16 0744 03/27/16 1128 03/27/16 1653 03/27/16 2048 03/28/16 0717  GLUCAP 102* 80 89 110* 108*   D-Dimer No results for input(s): DDIMER in the last 72 hours. Hgb A1c No results for input(s): HGBA1C in the last 72 hours. Lipid Profile No results for input(s): CHOL, HDL, LDLCALC, TRIG, CHOLHDL, LDLDIRECT in the last 72 hours. Thyroid function studies No results for input(s): TSH, T4TOTAL, T3FREE, THYROIDAB in the last 72 hours.  Invalid input(s): FREET3 Anemia work up No results for input(s): VITAMINB12, FOLATE, FERRITIN, TIBC, IRON, RETICCTPCT in the last 72 hours. Urinalysis    Component Value Date/Time   COLORURINE STRAW (A) 03/24/2016 2001   APPEARANCEUR CLEAR 03/24/2016 2001   LABSPEC 1.003 (L) 03/24/2016 2001   PHURINE 6.0 03/24/2016 2001   GLUCOSEU NEGATIVE 03/24/2016 2001   HGBUR NEGATIVE 03/24/2016 2001   [place] NEGATIVE 03/24/2016 2001   [place] 03/24/2016 2001    PROTEINUR NEGATIVE 03/24/2016 2001   NITRITE NEGATIVE 03/24/2016 2001   LEUKOCYTESUR NEGATIVE 03/24/2016 2001   Sepsis Labs Invalid input(s): PROCALCITONIN,  WBC,  LACTICIDVEN Microbiology No results found for this or any previous visit (from the past 240 hour(s)).   Time coordinating discharge: Over 30 minutes  SIGNED:   Debbe Odea, MD  [facility] 03/28/2016, 11:03 AM Pager   If 7PM-7AM, please contact night-coverage [email or web address] Password [email or web address]

## 2016-03-29 DIAGNOSIS — I10 Essential (primary) hypertension: Secondary | ICD-10-CM | POA: Diagnosis not present

## 2016-03-29 DIAGNOSIS — E119 Type 2 diabetes mellitus without complications: Secondary | ICD-10-CM | POA: Diagnosis not present

## 2016-03-29 DIAGNOSIS — F0781 Postconcussional syndrome: Secondary | ICD-10-CM | POA: Diagnosis not present

## 2016-04-01 DIAGNOSIS — S82832A Other fracture of upper and lower end of left fibula, initial encounter for closed fracture: Secondary | ICD-10-CM | POA: Diagnosis not present

## 2016-04-10 DIAGNOSIS — S060X0D Concussion without loss of consciousness, subsequent encounter: Secondary | ICD-10-CM | POA: Diagnosis not present

## 2016-04-10 DIAGNOSIS — K219 Gastro-esophageal reflux disease without esophagitis: Secondary | ICD-10-CM | POA: Diagnosis not present

## 2016-04-10 DIAGNOSIS — M6281 Muscle weakness (generalized): Secondary | ICD-10-CM | POA: Diagnosis not present

## 2016-04-10 DIAGNOSIS — E119 Type 2 diabetes mellitus without complications: Secondary | ICD-10-CM | POA: Diagnosis not present

## 2016-04-10 DIAGNOSIS — S92902D Unspecified fracture of left foot, subsequent encounter for fracture with routine healing: Secondary | ICD-10-CM | POA: Diagnosis not present

## 2016-04-10 DIAGNOSIS — S82892D Other fracture of left lower leg, subsequent encounter for closed fracture with routine healing: Secondary | ICD-10-CM | POA: Diagnosis not present

## 2016-04-11 DIAGNOSIS — S92902D Unspecified fracture of left foot, subsequent encounter for fracture with routine healing: Secondary | ICD-10-CM | POA: Diagnosis not present

## 2016-04-11 DIAGNOSIS — S060X0D Concussion without loss of consciousness, subsequent encounter: Secondary | ICD-10-CM | POA: Diagnosis not present

## 2016-04-11 DIAGNOSIS — K219 Gastro-esophageal reflux disease without esophagitis: Secondary | ICD-10-CM | POA: Diagnosis not present

## 2016-04-11 DIAGNOSIS — M6281 Muscle weakness (generalized): Secondary | ICD-10-CM | POA: Diagnosis not present

## 2016-04-11 DIAGNOSIS — S82892D Other fracture of left lower leg, subsequent encounter for closed fracture with routine healing: Secondary | ICD-10-CM | POA: Diagnosis not present

## 2016-04-11 DIAGNOSIS — E119 Type 2 diabetes mellitus without complications: Secondary | ICD-10-CM | POA: Diagnosis not present

## 2016-04-15 DIAGNOSIS — S92902D Unspecified fracture of left foot, subsequent encounter for fracture with routine healing: Secondary | ICD-10-CM | POA: Diagnosis not present

## 2016-04-15 DIAGNOSIS — K219 Gastro-esophageal reflux disease without esophagitis: Secondary | ICD-10-CM | POA: Diagnosis not present

## 2016-04-15 DIAGNOSIS — E119 Type 2 diabetes mellitus without complications: Secondary | ICD-10-CM | POA: Diagnosis not present

## 2016-04-15 DIAGNOSIS — S060X0D Concussion without loss of consciousness, subsequent encounter: Secondary | ICD-10-CM | POA: Diagnosis not present

## 2016-04-15 DIAGNOSIS — S82892D Other fracture of left lower leg, subsequent encounter for closed fracture with routine healing: Secondary | ICD-10-CM | POA: Diagnosis not present

## 2016-04-15 DIAGNOSIS — M6281 Muscle weakness (generalized): Secondary | ICD-10-CM | POA: Diagnosis not present

## 2016-04-17 DIAGNOSIS — S82892D Other fracture of left lower leg, subsequent encounter for closed fracture with routine healing: Secondary | ICD-10-CM | POA: Diagnosis not present

## 2016-04-17 DIAGNOSIS — M6281 Muscle weakness (generalized): Secondary | ICD-10-CM | POA: Diagnosis not present

## 2016-04-17 DIAGNOSIS — S060X0D Concussion without loss of consciousness, subsequent encounter: Secondary | ICD-10-CM | POA: Diagnosis not present

## 2016-04-17 DIAGNOSIS — S92902D Unspecified fracture of left foot, subsequent encounter for fracture with routine healing: Secondary | ICD-10-CM | POA: Diagnosis not present

## 2016-04-17 DIAGNOSIS — K219 Gastro-esophageal reflux disease without esophagitis: Secondary | ICD-10-CM | POA: Diagnosis not present

## 2016-04-17 DIAGNOSIS — E119 Type 2 diabetes mellitus without complications: Secondary | ICD-10-CM | POA: Diagnosis not present

## 2016-04-22 DIAGNOSIS — S82892D Other fracture of left lower leg, subsequent encounter for closed fracture with routine healing: Secondary | ICD-10-CM | POA: Diagnosis not present

## 2016-04-22 DIAGNOSIS — S92902D Unspecified fracture of left foot, subsequent encounter for fracture with routine healing: Secondary | ICD-10-CM | POA: Diagnosis not present

## 2016-04-22 DIAGNOSIS — S060X0D Concussion without loss of consciousness, subsequent encounter: Secondary | ICD-10-CM | POA: Diagnosis not present

## 2016-04-22 DIAGNOSIS — E119 Type 2 diabetes mellitus without complications: Secondary | ICD-10-CM | POA: Diagnosis not present

## 2016-04-22 DIAGNOSIS — M6281 Muscle weakness (generalized): Secondary | ICD-10-CM | POA: Diagnosis not present

## 2016-04-22 DIAGNOSIS — K219 Gastro-esophageal reflux disease without esophagitis: Secondary | ICD-10-CM | POA: Diagnosis not present

## 2016-04-24 DIAGNOSIS — S060X0D Concussion without loss of consciousness, subsequent encounter: Secondary | ICD-10-CM | POA: Diagnosis not present

## 2016-04-24 DIAGNOSIS — K219 Gastro-esophageal reflux disease without esophagitis: Secondary | ICD-10-CM | POA: Diagnosis not present

## 2016-04-24 DIAGNOSIS — E119 Type 2 diabetes mellitus without complications: Secondary | ICD-10-CM | POA: Diagnosis not present

## 2016-04-24 DIAGNOSIS — M6281 Muscle weakness (generalized): Secondary | ICD-10-CM | POA: Diagnosis not present

## 2016-04-24 DIAGNOSIS — S92902D Unspecified fracture of left foot, subsequent encounter for fracture with routine healing: Secondary | ICD-10-CM | POA: Diagnosis not present

## 2016-04-24 DIAGNOSIS — S82892D Other fracture of left lower leg, subsequent encounter for closed fracture with routine healing: Secondary | ICD-10-CM | POA: Diagnosis not present

## 2016-04-29 DIAGNOSIS — K219 Gastro-esophageal reflux disease without esophagitis: Secondary | ICD-10-CM | POA: Diagnosis not present

## 2016-04-29 DIAGNOSIS — M6281 Muscle weakness (generalized): Secondary | ICD-10-CM | POA: Diagnosis not present

## 2016-04-29 DIAGNOSIS — S82832D Other fracture of upper and lower end of left fibula, subsequent encounter for closed fracture with routine healing: Secondary | ICD-10-CM | POA: Diagnosis not present

## 2016-04-29 DIAGNOSIS — S82892D Other fracture of left lower leg, subsequent encounter for closed fracture with routine healing: Secondary | ICD-10-CM | POA: Diagnosis not present

## 2016-04-29 DIAGNOSIS — S92902D Unspecified fracture of left foot, subsequent encounter for fracture with routine healing: Secondary | ICD-10-CM | POA: Diagnosis not present

## 2016-04-29 DIAGNOSIS — E119 Type 2 diabetes mellitus without complications: Secondary | ICD-10-CM | POA: Diagnosis not present

## 2016-04-29 DIAGNOSIS — S060X0D Concussion without loss of consciousness, subsequent encounter: Secondary | ICD-10-CM | POA: Diagnosis not present

## 2016-05-01 DIAGNOSIS — E119 Type 2 diabetes mellitus without complications: Secondary | ICD-10-CM | POA: Diagnosis not present

## 2016-05-01 DIAGNOSIS — S82892D Other fracture of left lower leg, subsequent encounter for closed fracture with routine healing: Secondary | ICD-10-CM | POA: Diagnosis not present

## 2016-05-01 DIAGNOSIS — M6281 Muscle weakness (generalized): Secondary | ICD-10-CM | POA: Diagnosis not present

## 2016-05-01 DIAGNOSIS — K219 Gastro-esophageal reflux disease without esophagitis: Secondary | ICD-10-CM | POA: Diagnosis not present

## 2016-05-01 DIAGNOSIS — S060X0D Concussion without loss of consciousness, subsequent encounter: Secondary | ICD-10-CM | POA: Diagnosis not present

## 2016-05-01 DIAGNOSIS — S92902D Unspecified fracture of left foot, subsequent encounter for fracture with routine healing: Secondary | ICD-10-CM | POA: Diagnosis not present

## 2016-05-07 DIAGNOSIS — K219 Gastro-esophageal reflux disease without esophagitis: Secondary | ICD-10-CM | POA: Diagnosis not present

## 2016-05-07 DIAGNOSIS — L821 Other seborrheic keratosis: Secondary | ICD-10-CM | POA: Diagnosis not present

## 2016-05-07 DIAGNOSIS — S82892D Other fracture of left lower leg, subsequent encounter for closed fracture with routine healing: Secondary | ICD-10-CM | POA: Diagnosis not present

## 2016-05-07 DIAGNOSIS — L814 Other melanin hyperpigmentation: Secondary | ICD-10-CM | POA: Diagnosis not present

## 2016-05-07 DIAGNOSIS — E119 Type 2 diabetes mellitus without complications: Secondary | ICD-10-CM | POA: Diagnosis not present

## 2016-05-07 DIAGNOSIS — Z85820 Personal history of malignant melanoma of skin: Secondary | ICD-10-CM | POA: Diagnosis not present

## 2016-05-07 DIAGNOSIS — S060X0D Concussion without loss of consciousness, subsequent encounter: Secondary | ICD-10-CM | POA: Diagnosis not present

## 2016-05-07 DIAGNOSIS — D1801 Hemangioma of skin and subcutaneous tissue: Secondary | ICD-10-CM | POA: Diagnosis not present

## 2016-05-07 DIAGNOSIS — M6281 Muscle weakness (generalized): Secondary | ICD-10-CM | POA: Diagnosis not present

## 2016-05-07 DIAGNOSIS — S92902D Unspecified fracture of left foot, subsequent encounter for fracture with routine healing: Secondary | ICD-10-CM | POA: Diagnosis not present

## 2016-05-15 DIAGNOSIS — S82892D Other fracture of left lower leg, subsequent encounter for closed fracture with routine healing: Secondary | ICD-10-CM | POA: Diagnosis not present

## 2016-05-15 DIAGNOSIS — S060X0D Concussion without loss of consciousness, subsequent encounter: Secondary | ICD-10-CM | POA: Diagnosis not present

## 2016-05-15 DIAGNOSIS — E119 Type 2 diabetes mellitus without complications: Secondary | ICD-10-CM | POA: Diagnosis not present

## 2016-05-15 DIAGNOSIS — S92902D Unspecified fracture of left foot, subsequent encounter for fracture with routine healing: Secondary | ICD-10-CM | POA: Diagnosis not present

## 2016-05-15 DIAGNOSIS — M6281 Muscle weakness (generalized): Secondary | ICD-10-CM | POA: Diagnosis not present

## 2016-05-15 DIAGNOSIS — K219 Gastro-esophageal reflux disease without esophagitis: Secondary | ICD-10-CM | POA: Diagnosis not present

## 2016-09-07 DIAGNOSIS — J189 Pneumonia, unspecified organism: Secondary | ICD-10-CM | POA: Diagnosis not present

## 2016-09-17 DIAGNOSIS — R0609 Other forms of dyspnea: Secondary | ICD-10-CM | POA: Diagnosis not present

## 2016-09-17 DIAGNOSIS — L814 Other melanin hyperpigmentation: Secondary | ICD-10-CM | POA: Diagnosis not present

## 2016-09-17 DIAGNOSIS — M8588 Other specified disorders of bone density and structure, other site: Secondary | ICD-10-CM | POA: Diagnosis not present

## 2016-09-17 DIAGNOSIS — E119 Type 2 diabetes mellitus without complications: Secondary | ICD-10-CM | POA: Diagnosis not present

## 2016-09-17 DIAGNOSIS — E039 Hypothyroidism, unspecified: Secondary | ICD-10-CM | POA: Diagnosis not present

## 2016-09-17 DIAGNOSIS — R002 Palpitations: Secondary | ICD-10-CM | POA: Diagnosis not present

## 2016-09-17 DIAGNOSIS — D225 Melanocytic nevi of trunk: Secondary | ICD-10-CM | POA: Diagnosis not present

## 2016-09-17 DIAGNOSIS — Z1389 Encounter for screening for other disorder: Secondary | ICD-10-CM | POA: Diagnosis not present

## 2016-09-17 DIAGNOSIS — L821 Other seborrheic keratosis: Secondary | ICD-10-CM | POA: Diagnosis not present

## 2016-09-17 DIAGNOSIS — K219 Gastro-esophageal reflux disease without esophagitis: Secondary | ICD-10-CM | POA: Diagnosis not present

## 2016-09-17 DIAGNOSIS — E785 Hyperlipidemia, unspecified: Secondary | ICD-10-CM | POA: Diagnosis not present

## 2016-09-17 DIAGNOSIS — E559 Vitamin D deficiency, unspecified: Secondary | ICD-10-CM | POA: Diagnosis not present

## 2016-09-17 DIAGNOSIS — K7581 Nonalcoholic steatohepatitis (NASH): Secondary | ICD-10-CM | POA: Diagnosis not present

## 2016-09-17 DIAGNOSIS — Z0000 Encounter for general adult medical examination without abnormal findings: Secondary | ICD-10-CM | POA: Diagnosis not present

## 2016-09-17 DIAGNOSIS — Z85820 Personal history of malignant melanoma of skin: Secondary | ICD-10-CM | POA: Diagnosis not present

## 2016-09-20 ENCOUNTER — Other Ambulatory Visit: Payer: Self-pay | Admitting: Family Medicine

## 2016-09-20 DIAGNOSIS — R9389 Abnormal findings on diagnostic imaging of other specified body structures: Secondary | ICD-10-CM

## 2016-09-23 DIAGNOSIS — J209 Acute bronchitis, unspecified: Secondary | ICD-10-CM | POA: Diagnosis not present

## 2016-09-24 DIAGNOSIS — E785 Hyperlipidemia, unspecified: Secondary | ICD-10-CM | POA: Diagnosis not present

## 2016-09-24 DIAGNOSIS — R002 Palpitations: Secondary | ICD-10-CM | POA: Diagnosis not present

## 2016-09-24 DIAGNOSIS — I340 Nonrheumatic mitral (valve) insufficiency: Secondary | ICD-10-CM | POA: Diagnosis not present

## 2016-09-24 DIAGNOSIS — K219 Gastro-esophageal reflux disease without esophagitis: Secondary | ICD-10-CM | POA: Diagnosis not present

## 2016-09-24 DIAGNOSIS — E039 Hypothyroidism, unspecified: Secondary | ICD-10-CM | POA: Diagnosis not present

## 2016-09-24 DIAGNOSIS — E119 Type 2 diabetes mellitus without complications: Secondary | ICD-10-CM | POA: Diagnosis not present

## 2016-09-24 DIAGNOSIS — R0602 Shortness of breath: Secondary | ICD-10-CM | POA: Diagnosis not present

## 2016-09-24 DIAGNOSIS — I10 Essential (primary) hypertension: Secondary | ICD-10-CM | POA: Diagnosis not present

## 2016-09-24 DIAGNOSIS — G4733 Obstructive sleep apnea (adult) (pediatric): Secondary | ICD-10-CM | POA: Diagnosis not present

## 2016-09-25 DIAGNOSIS — E119 Type 2 diabetes mellitus without complications: Secondary | ICD-10-CM | POA: Diagnosis not present

## 2016-09-25 DIAGNOSIS — E039 Hypothyroidism, unspecified: Secondary | ICD-10-CM | POA: Diagnosis not present

## 2016-09-25 DIAGNOSIS — K219 Gastro-esophageal reflux disease without esophagitis: Secondary | ICD-10-CM | POA: Diagnosis not present

## 2016-09-25 DIAGNOSIS — R0602 Shortness of breath: Secondary | ICD-10-CM | POA: Diagnosis not present

## 2016-09-25 DIAGNOSIS — G4733 Obstructive sleep apnea (adult) (pediatric): Secondary | ICD-10-CM | POA: Diagnosis not present

## 2016-09-25 DIAGNOSIS — I10 Essential (primary) hypertension: Secondary | ICD-10-CM | POA: Diagnosis not present

## 2016-09-25 DIAGNOSIS — E668 Other obesity: Secondary | ICD-10-CM | POA: Diagnosis not present

## 2016-09-25 DIAGNOSIS — I340 Nonrheumatic mitral (valve) insufficiency: Secondary | ICD-10-CM | POA: Diagnosis not present

## 2016-09-25 DIAGNOSIS — E785 Hyperlipidemia, unspecified: Secondary | ICD-10-CM | POA: Diagnosis not present

## 2016-09-25 DIAGNOSIS — Z8249 Family history of ischemic heart disease and other diseases of the circulatory system: Secondary | ICD-10-CM | POA: Diagnosis not present

## 2016-09-25 DIAGNOSIS — R002 Palpitations: Secondary | ICD-10-CM | POA: Diagnosis not present

## 2016-09-26 ENCOUNTER — Ambulatory Visit
Admission: RE | Admit: 2016-09-26 | Discharge: 2016-09-26 | Disposition: A | Discharge status: Home / Self Care | Payer: Medicare Other | Source: Ambulatory Visit | Attending: Family Medicine | Admitting: Family Medicine

## 2016-09-26 DIAGNOSIS — R9389 Abnormal findings on diagnostic imaging of other specified body structures: Secondary | ICD-10-CM

## 2016-09-26 DIAGNOSIS — I6523 Occlusion and stenosis of bilateral carotid arteries: Secondary | ICD-10-CM | POA: Diagnosis not present

## 2016-09-27 ENCOUNTER — Encounter: Payer: Self-pay | Admitting: Cardiology

## 2016-09-27 DIAGNOSIS — R0600 Dyspnea, unspecified: Secondary | ICD-10-CM | POA: Diagnosis not present

## 2016-09-27 NOTE — Consult Note (Signed)
Dorothey Baseman  Date of visit:  09/24/2016 DOB:  [birth year]    Age:  71 yrs. Medical record number:  [id number]     Account number:  [id number] Primary Care Provider: [facility] ____________________________ CURRENT DIAGNOSES  1. Palpitations  2. Dyspnea  3. Essential hypertension  4. Type 2 diabetes mellitus without complications  5. Hyperlipidemia  6. Mitral valve regurgitation  7. Gastro-esophageal reflux disease  8. Family history of ischemic heart disease and other diseases of the circulatory system  9. Sleep apnea  10. Hypothyroidism  11. Obesity ____________________________ ALLERGIES  Amoxicillin, Nausea  Augmentin, Stomach ache  Clindamycin, Intolerance-unknown  Hmg-Coa Reductase Inhibitors, Elevated lfts  Keflex, Intolerance-unknown  Norvasc, Intolerance-unknown  Phenergan, Severe nausea & vomiting  Plavix, Intolerance-unknown  Sulfa (Sulfonamide Antibiotics), Intolerance-unknown ____________________________ MEDICATIONS  1. albuterol sulfate HFA 90 mcg/actuation aerosol inhaler, PRN  2. azithromycin 250 mg tablet, Take as directed  3. benazepril 10 mg tablet, 1 p.o. daily  4. benzonatate 100 mg capsule, PRN  5. lansoprazole 15 mg capsule,delayed release, 1 p.o. daily  6. levothyroxine 25 mcg tablet, 1 p.o. daily  7. pioglitazone 15 mg tablet, 1 p.o. daily  8. Zyrtec 10 mg tablet, 1 p.o. daily ____________________________ HISTORY OF PRESENT ILLNESS This very nice 71 year old female is seen at the request of Dr. Drema Dallas for evaluation of palpitations, dyspnea and chest pain. The patient moved here 3 years ago from [place]. She has a long-standing history of hypertension and diabetes. She has known hyperlipidemia likely familial and is unable to take statins because of significant elevation of liver enzymes. She reportedly has nonalcoholic fatty liver and has had a liver biopsy in the past. She has gained about 50 pounds over the past several years. She also has a  history of sleep apnea.  Her sister recently had a myocardial infarction and was diagnosed with significant CAD. The patient has complained of palpitations that she describes as a skipping or fluttering in her heart rate. This has occurred intermittently usually at rest but is not associated with dizziness or syncope. She is also noted dyspnea on exertion such as climbing stairs or walking long distances. She has had a vague sensation of chest discomfort and her left upper chest that she describes sometimes his pressure and sometimes as a pinching sensation. It is difficult to tell whether the symptoms are related to exertion or not. She does not have PND, orthopnea or edema. She does not get much in the way of regular exercise. ____________________________ PAST HISTORY  Past Medical Illnesses:  hypertension, DM-non-insulin dependent, hyperlipidemia, hypothyroidism, GERD, melanoma, Meniere's syndrome, sleep apnea, NASH, carcinoma-basal cell, stroke, Hashimoto's throiditis;  Cardiovascular Illnesses:  mitral regurgitation;  Infectious Diseases:  no previous history of significant infectious diseases;  Surgical Procedures:  bunion surgery, hysterectomy, removal of malignant melanoma, removal of ganglion cyst and Batholin cyst;  Trauma History:  no previous history of significant trauma;  NYHA Classification:  I;  Canadian Angina Classification:  Class 0: Asymptomatic;  Cardiology Procedures-Invasive:  no previous interventional or invasive cardiology procedures;  Cardiology Procedures-Noninvasive:  echocardiogram 2015;  Peripheral Vascular Procedures:  no previous invasive peripheral vascular procedures.;  LVEF not documented,   ____________________________ CARDIO-PULMONARY TEST DATES EKG Date:  09/24/2016;  Echocardiography Date: 03/31/2013;   ____________________________ FAMILY HISTORY Father -- Medical history unknown Mother -- Mother dead, Malignant tumor of stomach Sister -- Sister alive with  problem, Atrial fibrillation, COPD, Hypertension, Hypercholesterolemia Sister -- Sister dead, Cancer Sister -- Sister alive and well Sister -- Sister  alive with problem, Hypertension, Myocardial infarction at greater than 82, Hypercholesterolemia ____________________________ SOCIAL HISTORY Alcohol Use:  wine;  Smoking:  never smoked;  Diet:  regular diet;  Lifestyle:  divorced;  Exercise:  no regular exercise;  Occupation:  retired Educational psychologist;  Residence:  lives alone;   ____________________________ REVIEW OF SYSTEMS General:  denies recent weight change, fatique or change in exercise tolerance.  Integumentary:skin cancers Eyes: early cataracts Ears, Nose, Throat, Mouth:  denies any hearing loss, epistaxis, hoarseness or difficulty speaking. Respiratory: dyspnea with exertion, recurrent upper respiratory infection, sleep apnea Cardiovascular:  please review HPI Abdominal: dyspepsia, hiatal hernia, history of GERD and history of stomach bleedsGenitourinary-Female: mild stress incontinence Musculoskeletal:  arthritis of the feet Neurological:  occasional migraine headaches, concussion recently Psychiatric:  denies depression or anxiety Hematological/Immunologic:  denies any food allergies, bleeding disorders. ____________________________ PHYSICAL EXAMINATION VITAL SIGNS  Blood Pressure:  120/60 Sitting, Right arm, regular cuff  , 124/54 Standing, Right arm and regular cuff   Pulse:  84/min. Weight:  194.00 lbs. Height:  59"BMI: 39  Constitutional:  cooperative, alert and oriented,well developed, well nourished, in no acute distress. Skin:  warm and dry to touch, no apparent skin lesions, or masses noted. Head:  normocephalic, normal hair pattern, no masses or tenderness Eyes:  EOMS Intact, PERRLA, C and S clear, Funduscopic exam not done. ENT:  ears, nose and throat reveal no gross abnormalities.  Dentition good. Neck:  supple, without massess. No JVD, thyromegaly or carotid bruits.  Carotid upstroke normal. Chest:  normal symmetry, clear to auscultation. Cardiac:  regular rhythm, normal S1 and S2, No S3 or S4, no murmurs, gallops or rubs detected. Abdomen:  abdomen soft,non-tender, no masses, no hepatospenomegaly, or aneurysm noted Peripheral Pulses:  the femoral,dorsalis pedis, and posterior tibial pulses are full and equal bilaterally with no bruits auscultated. Extremities & Back:  no deformities, clubbing, cyanosis, erythema or edema observed. Normal muscle strength and tone. Neurological:  no gross motor or sensory deficits noted, affect appropriate, oriented x3. ____________________________ MOST RECENT LIPID PANEL 09/12/15  CHOL TOTL 311 mg/dl, LDL 209 NM, HDL 44 mg/dl, TRIGLYCER 290 mg/dl, ALT 17 u/l, ALK PHOS 59 u/l, CHOL/HDL 7.1 (Calc) and AST 20 u/l ____________________________ IMPRESSIONS/PLAN 1. Exertional dyspnea and atypical chest pain in a patient with multiple cardiovascular risk factors 2. Episodic palpitations 3. Hypertension 4. Type 2 diabetes mellitus without complications 5. Hyperlipidemia with significant statin intolerance 6. Obesity 7. NASH  8. Sleep apnea  RECOMMENDATIONS:  Patient has multiple cardiovascular risk factors with vague symptoms of dyspnea and atypical chest pain. She has a prior history of mild aortic sclerosis on echocardiogram in 2015. I would recommend that she have a repeat echocardiogram to assess dyspnea and that she start with a standard exercise stress test. Twelve-lead EKG personally reviewed by me shows sinus rhythm with left axis deviation and incomplete right bundle branch block. I would also recommend that she wear a cardiac event monitor to assess the palpitations.  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 After we've had a chance to assess her workup she would likely need to go on a PCSK9 inhibitor in view of her previous failure to be able to take  statins.  ____________________________ TODAYS ORDERS  1. 2D, color flow, doppler: At Patient Convenience  2. 12 Lead EKG: Today  3. [place]: Today  4. Return Visit: 1 month  5. treadmill:  Regular TM First Available  09/27/16  TREADMILL  The patient exercised on the standard Bruce protocol a total of 4:30 minutes into Bruce stage 2 achieving a workload of 6 METS. The test was stopped due to dyspnea and fatigue. The patient had no chest pain suggestive of angina. Heart rate rose to 142 which was 94% of predicted maximum. Blood pressure response was normal.  12-lead EKG shows a right bundle branch block at rest. With exercise the patient achieved target heart rate and had 2 mm of ST segment depression consistent with ischemia in the lateral leads associated with T-wave changes in recovery. A brief burst of atrial fibrillation was noted in recovery.  IMPRESSIONS:  1. Clinically negative for ischemia 2. EKG positive for ischemia at a low-level of exercise  Recommendations:  The patient has an abnormal exercise treadmill test at a low level of exercise and has multiple cardiovascular risk factors. She is also severely obese. I would recommend that she undergo cardiac catheterization. She does mention having a catheterization over 10 years ago in [place]. She also has familial hypercholesterolemia and I would recommend treatment with a PCSK9 inhibitor. Cardiac catheterization was discussed with the patient including risks of myocardial infarction, death, stroke, bleeding, arrhythmia, dye allergy, or renal insufficiency. She understands and is willing to proceed. Possibility of percutaneous intervention at the same setting was also discussed with the patient including risks. We will plan to do a normal echocardiogram Monday to evaluate LV function.  ____________________________ Cardiology Physician:  Kerry Hough MD [facility]

## 2016-09-30 DIAGNOSIS — E039 Hypothyroidism, unspecified: Secondary | ICD-10-CM | POA: Diagnosis not present

## 2016-09-30 DIAGNOSIS — I10 Essential (primary) hypertension: Secondary | ICD-10-CM | POA: Diagnosis not present

## 2016-09-30 DIAGNOSIS — R0609 Other forms of dyspnea: Secondary | ICD-10-CM | POA: Diagnosis not present

## 2016-09-30 DIAGNOSIS — R0602 Shortness of breath: Secondary | ICD-10-CM | POA: Diagnosis not present

## 2016-09-30 DIAGNOSIS — E119 Type 2 diabetes mellitus without complications: Secondary | ICD-10-CM | POA: Diagnosis not present

## 2016-09-30 DIAGNOSIS — E668 Other obesity: Secondary | ICD-10-CM | POA: Diagnosis not present

## 2016-09-30 DIAGNOSIS — R002 Palpitations: Secondary | ICD-10-CM | POA: Diagnosis not present

## 2016-09-30 DIAGNOSIS — K219 Gastro-esophageal reflux disease without esophagitis: Secondary | ICD-10-CM | POA: Diagnosis not present

## 2016-09-30 DIAGNOSIS — G4733 Obstructive sleep apnea (adult) (pediatric): Secondary | ICD-10-CM | POA: Diagnosis not present

## 2016-09-30 DIAGNOSIS — I340 Nonrheumatic mitral (valve) insufficiency: Secondary | ICD-10-CM | POA: Diagnosis not present

## 2016-09-30 DIAGNOSIS — E785 Hyperlipidemia, unspecified: Secondary | ICD-10-CM | POA: Diagnosis not present

## 2016-09-30 DIAGNOSIS — Z8249 Family history of ischemic heart disease and other diseases of the circulatory system: Secondary | ICD-10-CM | POA: Diagnosis not present

## 2016-10-01 ENCOUNTER — Other Ambulatory Visit: Payer: Self-pay | Admitting: Cardiology

## 2016-10-03 ENCOUNTER — Ambulatory Visit (HOSPITAL_COMMUNITY)
Admission: RE | Disposition: A | Discharge status: Home / Self Care | Payer: Self-pay | Source: Ambulatory Visit | Attending: Internal Medicine

## 2016-10-03 ENCOUNTER — Ambulatory Visit (HOSPITAL_COMMUNITY)
Admission: RE | Admit: 2016-10-03 | Discharge: 2016-10-03 | Disposition: A | Discharge status: Home / Self Care | Payer: Medicare Other | Source: Ambulatory Visit | Attending: Internal Medicine | Admitting: Internal Medicine

## 2016-10-03 DIAGNOSIS — R0789 Other chest pain: Secondary | ICD-10-CM | POA: Insufficient documentation

## 2016-10-03 DIAGNOSIS — Z8249 Family history of ischemic heart disease and other diseases of the circulatory system: Secondary | ICD-10-CM | POA: Insufficient documentation

## 2016-10-03 DIAGNOSIS — Z880 Allergy status to penicillin: Secondary | ICD-10-CM | POA: Insufficient documentation

## 2016-10-03 DIAGNOSIS — I4510 Unspecified right bundle-branch block: Secondary | ICD-10-CM | POA: Insufficient documentation

## 2016-10-03 DIAGNOSIS — I340 Nonrheumatic mitral (valve) insufficiency: Secondary | ICD-10-CM | POA: Insufficient documentation

## 2016-10-03 DIAGNOSIS — R0602 Shortness of breath: Secondary | ICD-10-CM | POA: Diagnosis not present

## 2016-10-03 DIAGNOSIS — R0609 Other forms of dyspnea: Secondary | ICD-10-CM | POA: Diagnosis not present

## 2016-10-03 DIAGNOSIS — H8109 Meniere's disease, unspecified ear: Secondary | ICD-10-CM | POA: Insufficient documentation

## 2016-10-03 DIAGNOSIS — Z882 Allergy status to sulfonamides status: Secondary | ICD-10-CM | POA: Insufficient documentation

## 2016-10-03 DIAGNOSIS — E669 Obesity, unspecified: Secondary | ICD-10-CM | POA: Insufficient documentation

## 2016-10-03 DIAGNOSIS — K219 Gastro-esophageal reflux disease without esophagitis: Secondary | ICD-10-CM | POA: Diagnosis not present

## 2016-10-03 DIAGNOSIS — G4733 Obstructive sleep apnea (adult) (pediatric): Secondary | ICD-10-CM | POA: Diagnosis not present

## 2016-10-03 DIAGNOSIS — Z6839 Body mass index (BMI) 39.0-39.9, adult: Secondary | ICD-10-CM | POA: Diagnosis not present

## 2016-10-03 DIAGNOSIS — E119 Type 2 diabetes mellitus without complications: Secondary | ICD-10-CM | POA: Insufficient documentation

## 2016-10-03 DIAGNOSIS — R002 Palpitations: Secondary | ICD-10-CM | POA: Insufficient documentation

## 2016-10-03 DIAGNOSIS — R9439 Abnormal result of other cardiovascular function study: Secondary | ICD-10-CM | POA: Diagnosis not present

## 2016-10-03 DIAGNOSIS — E7801 Familial hypercholesterolemia: Secondary | ICD-10-CM | POA: Insufficient documentation

## 2016-10-03 DIAGNOSIS — R0600 Dyspnea, unspecified: Secondary | ICD-10-CM | POA: Diagnosis present

## 2016-10-03 DIAGNOSIS — I10 Essential (primary) hypertension: Secondary | ICD-10-CM | POA: Insufficient documentation

## 2016-10-03 DIAGNOSIS — E063 Autoimmune thyroiditis: Secondary | ICD-10-CM | POA: Insufficient documentation

## 2016-10-03 DIAGNOSIS — E039 Hypothyroidism, unspecified: Secondary | ICD-10-CM | POA: Diagnosis not present

## 2016-10-03 HISTORY — PX: LEFT HEART CATH AND CORONARY ANGIOGRAPHY: CATH118249

## 2016-10-03 LAB — CBC
HCT: 41.5 % (ref 36.0–46.0)
HEMOGLOBIN: 13.2 g/dL (ref 12.0–15.0)
MCH: 30 pg (ref 26.0–34.0)
MCHC: 31.8 g/dL (ref 30.0–36.0)
MCV: 94.3 fL (ref 78.0–100.0)
PLATELETS: 185 10*3/uL (ref 150–400)
RBC: 4.4 MIL/uL (ref 3.87–5.11)
RDW: 14.1 % (ref 11.5–15.5)
WBC: 5.6 10*3/uL (ref 4.0–10.5)

## 2016-10-03 LAB — BASIC METABOLIC PANEL
Anion gap: 12 (ref 5–15)
BUN: 21 mg/dL — AB (ref 6–20)
CALCIUM: 9.4 mg/dL (ref 8.9–10.3)
CO2: 22 mmol/L (ref 22–32)
CREATININE: 0.79 mg/dL (ref 0.44–1.00)
Chloride: 107 mmol/L (ref 101–111)
GFR calc Af Amer: >60 mL/min (ref >60)
GLUCOSE: 99 mg/dL (ref 65–99)
Potassium: 4.3 mmol/L (ref 3.5–5.1)
Sodium: 141 mmol/L (ref 135–145)

## 2016-10-03 LAB — PROTIME-INR
INR: 1.02
Prothrombin Time: 13.3 seconds (ref 11.4–15.2)

## 2016-10-03 LAB — GLUCOSE, CAPILLARY: Glucose-Capillary: 77 mg/dL (ref 65–99)

## 2016-10-03 SURGERY — LEFT HEART CATH AND CORONARY ANGIOGRAPHY
Anesthesia: LOCAL

## 2016-10-03 MED ORDER — ASPIRIN 81 MG PO CHEW
81.0000 mg | CHEWABLE_TABLET | Freq: Once | ORAL | Status: AC
  Administered 2016-10-03: 81 mg via ORAL

## 2016-10-03 MED ORDER — MIDAZOLAM HCL 2 MG/2ML IJ SOLN
INTRAMUSCULAR | Status: AC
  Filled 2016-10-03: qty 2

## 2016-10-03 MED ORDER — IOPAMIDOL (ISOVUE-370) INJECTION 76%
INTRAVENOUS | Status: AC
  Filled 2016-10-03: qty 100

## 2016-10-03 MED ORDER — SODIUM CHLORIDE 0.9 % IV SOLN: 250.0000 mL | INTRAVENOUS | Status: DC | PRN

## 2016-10-03 MED ORDER — LIDOCAINE HCL 2 % IJ SOLN
INTRAMUSCULAR | Status: AC
  Filled 2016-10-03: qty 10

## 2016-10-03 MED ORDER — LIDOCAINE HCL (PF) 1 % IJ SOLN
INTRAMUSCULAR | Status: DC | PRN
  Administered 2016-10-03: 2 mL via SUBCUTANEOUS

## 2016-10-03 MED ORDER — IOPAMIDOL (ISOVUE-370) INJECTION 76%
INTRAVENOUS | Status: DC | PRN
  Administered 2016-10-03: 60 mL via INTRA_ARTERIAL

## 2016-10-03 MED ORDER — VERAPAMIL HCL 2.5 MG/ML IV SOLN
INTRAVENOUS | Status: DC | PRN
  Administered 2016-10-03: 10 mL via INTRA_ARTERIAL

## 2016-10-03 MED ORDER — SODIUM CHLORIDE 0.9% FLUSH: 3.0000 mL | INTRAVENOUS | Status: DC | PRN

## 2016-10-03 MED ORDER — HEPARIN SODIUM (PORCINE) 1000 UNIT/ML IJ SOLN
INTRAMUSCULAR | Status: AC
  Filled 2016-10-03: qty 1

## 2016-10-03 MED ORDER — ASPIRIN 81 MG PO CHEW
CHEWABLE_TABLET | ORAL | Status: AC
  Administered 2016-10-03: 81 mg via ORAL
  Filled 2016-10-03: qty 1

## 2016-10-03 MED ORDER — SODIUM CHLORIDE 0.9 % IV SOLN: INTRAVENOUS | Status: DC

## 2016-10-03 MED ORDER — NITROGLYCERIN 1 MG/10 ML FOR IR/CATH LAB
INTRA_ARTERIAL | Status: DC | PRN
  Administered 2016-10-03: 200 ug via INTRACORONARY

## 2016-10-03 MED ORDER — FENTANYL CITRATE (PF) 100 MCG/2ML IJ SOLN
INTRAMUSCULAR | Status: DC | PRN
  Administered 2016-10-03: 25 ug via INTRAVENOUS

## 2016-10-03 MED ORDER — SODIUM CHLORIDE 0.9% FLUSH: 3.0000 mL | Freq: Two times a day (BID) | INTRAVENOUS | Status: DC

## 2016-10-03 MED ORDER — HEPARIN (PORCINE) IN NACL 2-0.9 UNIT/ML-% IJ SOLN
INTRAMUSCULAR | Status: AC | PRN
  Administered 2016-10-03: 1000 mL

## 2016-10-03 MED ORDER — SODIUM CHLORIDE 0.9 % WEIGHT BASED INFUSION
3.0000 mL/kg/h | INTRAVENOUS | Status: AC
  Administered 2016-10-03: 3 mL/kg/h via INTRAVENOUS

## 2016-10-03 MED ORDER — HEPARIN (PORCINE) IN NACL 2-0.9 UNIT/ML-% IJ SOLN
INTRAMUSCULAR | Status: AC
  Filled 2016-10-03: qty 1000

## 2016-10-03 MED ORDER — FENTANYL CITRATE (PF) 100 MCG/2ML IJ SOLN
INTRAMUSCULAR | Status: AC
  Filled 2016-10-03: qty 2

## 2016-10-03 MED ORDER — MIDAZOLAM HCL 2 MG/2ML IJ SOLN
INTRAMUSCULAR | Status: DC | PRN
  Administered 2016-10-03: 0.5 mg via INTRAVENOUS

## 2016-10-03 MED ORDER — HEPARIN SODIUM (PORCINE) 1000 UNIT/ML IJ SOLN
INTRAMUSCULAR | Status: DC | PRN
  Administered 2016-10-03: 4500 [IU] via INTRAVENOUS

## 2016-10-03 MED ORDER — SODIUM CHLORIDE 0.9 % WEIGHT BASED INFUSION: 1.0000 mL/kg/h | INTRAVENOUS | Status: DC

## 2016-10-03 MED ORDER — VERAPAMIL HCL 2.5 MG/ML IV SOLN
INTRAVENOUS | Status: AC
  Filled 2016-10-03: qty 2

## 2016-10-03 MED ORDER — NITROGLYCERIN 1 MG/10 ML FOR IR/CATH LAB
INTRA_ARTERIAL | Status: AC
  Filled 2016-10-03: qty 10

## 2016-10-03 SURGICAL SUPPLY — 12 items

## 2016-10-03 NOTE — H&P (View-Only) (Signed)
Jillian Jimenez  Date of visit:  09/24/2016 DOB:  [birth year]    Age:  71 yrs. Medical record number:  [id number]     Account number:  [id number] Primary Care Provider: [facility] ____________________________ CURRENT DIAGNOSES  1. Palpitations  2. Dyspnea  3. Essential hypertension  4. Type 2 diabetes mellitus without complications  5. Hyperlipidemia  6. Mitral valve regurgitation  7. Gastro-esophageal reflux disease  8. Family history of ischemic heart disease and other diseases of the circulatory system  9. Sleep apnea  10. Hypothyroidism  11. Obesity ____________________________ ALLERGIES  Amoxicillin, Nausea  Augmentin, Stomach ache  Clindamycin, Intolerance-unknown  Hmg-Coa Reductase Inhibitors, Elevated lfts  Keflex, Intolerance-unknown  Norvasc, Intolerance-unknown  Phenergan, Severe nausea & vomiting  Plavix, Intolerance-unknown  Sulfa (Sulfonamide Antibiotics), Intolerance-unknown ____________________________ MEDICATIONS  1. albuterol sulfate HFA 90 mcg/actuation aerosol inhaler, PRN  2. azithromycin 250 mg tablet, Take as directed  3. benazepril 10 mg tablet, 1 p.o. daily  4. benzonatate 100 mg capsule, PRN  5. lansoprazole 15 mg capsule,delayed release, 1 p.o. daily  6. levothyroxine 25 mcg tablet, 1 p.o. daily  7. pioglitazone 15 mg tablet, 1 p.o. daily  8. Zyrtec 10 mg tablet, 1 p.o. daily ____________________________ HISTORY OF PRESENT ILLNESS This very nice 71 year old female is seen at the request of Dr. Drema Dallas for evaluation of palpitations, dyspnea and chest pain. The patient moved here 3 years ago from [place]. She has a long-standing history of hypertension and diabetes. She has known hyperlipidemia likely familial and is unable to take statins because of significant elevation of liver enzymes. She reportedly has nonalcoholic fatty liver and has had a liver biopsy in the past. She has gained about 50 pounds over the past several years. She also has a  history of sleep apnea.  Her sister recently had a myocardial infarction and was diagnosed with significant CAD. The patient has complained of palpitations that she describes as a skipping or fluttering in her heart rate. This has occurred intermittently usually at rest but is not associated with dizziness or syncope. She is also noted dyspnea on exertion such as climbing stairs or walking long distances. She has had a vague sensation of chest discomfort and her left upper chest that she describes sometimes his pressure and sometimes as a pinching sensation. It is difficult to tell whether the symptoms are related to exertion or not. She does not have PND, orthopnea or edema. She does not get much in the way of regular exercise. ____________________________ PAST HISTORY  Past Medical Illnesses:  hypertension, DM-non-insulin dependent, hyperlipidemia, hypothyroidism, GERD, melanoma, Meniere's syndrome, sleep apnea, NASH, carcinoma-basal cell, stroke, Hashimoto's throiditis;  Cardiovascular Illnesses:  mitral regurgitation;  Infectious Diseases:  no previous history of significant infectious diseases;  Surgical Procedures:  bunion surgery, hysterectomy, removal of malignant melanoma, removal of ganglion cyst and Batholin cyst;  Trauma History:  no previous history of significant trauma;  NYHA Classification:  I;  Canadian Angina Classification:  Class 0: Asymptomatic;  Cardiology Procedures-Invasive:  no previous interventional or invasive cardiology procedures;  Cardiology Procedures-Noninvasive:  echocardiogram 2015;  Peripheral Vascular Procedures:  no previous invasive peripheral vascular procedures.;  LVEF not documented,   ____________________________ CARDIO-PULMONARY TEST DATES EKG Date:  09/24/2016;  Echocardiography Date: 03/31/2013;   ____________________________ FAMILY HISTORY Father -- Medical history unknown Mother -- Mother dead, Malignant tumor of stomach Sister -- Sister alive with  problem, Atrial fibrillation, COPD, Hypertension, Hypercholesterolemia Sister -- Sister dead, Cancer Sister -- Sister alive and well Sister -- Sister  alive with problem, Hypertension, Myocardial infarction at greater than 82, Hypercholesterolemia ____________________________ SOCIAL HISTORY Alcohol Use:  wine;  Smoking:  never smoked;  Diet:  regular diet;  Lifestyle:  divorced;  Exercise:  no regular exercise;  Occupation:  retired Educational psychologist;  Residence:  lives alone;   ____________________________ REVIEW OF SYSTEMS General:  denies recent weight change, fatique or change in exercise tolerance.  Integumentary:skin cancers Eyes: early cataracts Ears, Nose, Throat, Mouth:  denies any hearing loss, epistaxis, hoarseness or difficulty speaking. Respiratory: dyspnea with exertion, recurrent upper respiratory infection, sleep apnea Cardiovascular:  please review HPI Abdominal: dyspepsia, hiatal hernia, history of GERD and history of stomach bleedsGenitourinary-Female: mild stress incontinence Musculoskeletal:  arthritis of the feet Neurological:  occasional migraine headaches, concussion recently Psychiatric:  denies depression or anxiety Hematological/Immunologic:  denies any food allergies, bleeding disorders. ____________________________ PHYSICAL EXAMINATION VITAL SIGNS  Blood Pressure:  120/60 Sitting, Right arm, regular cuff  , 124/54 Standing, Right arm and regular cuff   Pulse:  84/min. Weight:  194.00 lbs. Height:  59"BMI: 39  Constitutional:  cooperative, alert and oriented,well developed, well nourished, in no acute distress. Skin:  warm and dry to touch, no apparent skin lesions, or masses noted. Head:  normocephalic, normal hair pattern, no masses or tenderness Eyes:  EOMS Intact, PERRLA, C and S clear, Funduscopic exam not done. ENT:  ears, nose and throat reveal no gross abnormalities.  Dentition good. Neck:  supple, without massess. No JVD, thyromegaly or carotid bruits.  Carotid upstroke normal. Chest:  normal symmetry, clear to auscultation. Cardiac:  regular rhythm, normal S1 and S2, No S3 or S4, no murmurs, gallops or rubs detected. Abdomen:  abdomen soft,non-tender, no masses, no hepatospenomegaly, or aneurysm noted Peripheral Pulses:  the femoral,dorsalis pedis, and posterior tibial pulses are full and equal bilaterally with no bruits auscultated. Extremities & Back:  no deformities, clubbing, cyanosis, erythema or edema observed. Normal muscle strength and tone. Neurological:  no gross motor or sensory deficits noted, affect appropriate, oriented x3. ____________________________ MOST RECENT LIPID PANEL 09/12/15  CHOL TOTL 311 mg/dl, LDL 209 NM, HDL 44 mg/dl, TRIGLYCER 290 mg/dl, ALT 17 u/l, ALK PHOS 59 u/l, CHOL/HDL 7.1 (Calc) and AST 20 u/l ____________________________ IMPRESSIONS/PLAN 1. Exertional dyspnea and atypical chest pain in a patient with multiple cardiovascular risk factors 2. Episodic palpitations 3. Hypertension 4. Type 2 diabetes mellitus without complications 5. Hyperlipidemia with significant statin intolerance 6. Obesity 7. NASH  8. Sleep apnea  RECOMMENDATIONS:  Patient has multiple cardiovascular risk factors with vague symptoms of dyspnea and atypical chest pain. She has a prior history of mild aortic sclerosis on echocardiogram in 2015. I would recommend that she have a repeat echocardiogram to assess dyspnea and that she start with a standard exercise stress test. Twelve-lead EKG personally reviewed by me shows sinus rhythm with left axis deviation and incomplete right bundle branch block. I would also recommend that she wear a cardiac event monitor to assess the palpitations.  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 After we've had a chance to assess her workup she would likely need to go on a PCSK9 inhibitor in view of her previous failure to be able to take  statins.  ____________________________ TODAYS ORDERS  1. 2D, color flow, doppler: At Patient Convenience  2. 12 Lead EKG: Today  3. [place]: Today  4. Return Visit: 1 month  5. treadmill:  Regular TM First Available  09/27/16  TREADMILL  The patient exercised on the standard Bruce protocol a total of 4:30 minutes into Bruce stage 2 achieving a workload of 6 METS. The test was stopped due to dyspnea and fatigue. The patient had no chest pain suggestive of angina. Heart rate rose to 142 which was 94% of predicted maximum. Blood pressure response was normal.  12-lead EKG shows a right bundle branch block at rest. With exercise the patient achieved target heart rate and had 2 mm of ST segment depression consistent with ischemia in the lateral leads associated with T-wave changes in recovery. A brief burst of atrial fibrillation was noted in recovery.  IMPRESSIONS:  1. Clinically negative for ischemia 2. EKG positive for ischemia at a low-level of exercise  Recommendations:  The patient has an abnormal exercise treadmill test at a low level of exercise and has multiple cardiovascular risk factors. She is also severely obese. I would recommend that she undergo cardiac catheterization. She does mention having a catheterization over 10 years ago in [place]. She also has familial hypercholesterolemia and I would recommend treatment with a PCSK9 inhibitor. Cardiac catheterization was discussed with the patient including risks of myocardial infarction, death, stroke, bleeding, arrhythmia, dye allergy, or renal insufficiency. She understands and is willing to proceed. Possibility of percutaneous intervention at the same setting was also discussed with the patient including risks. We will plan to do a normal echocardiogram Monday to evaluate LV function.  ____________________________ Cardiology Physician:  Kerry Hough MD [facility]

## 2016-10-03 NOTE — Discharge Instructions (Signed)

## 2016-10-03 NOTE — Interval H&P Note (Signed)
History and Physical Interval Note:  10/03/2016 1:54 PM  Jillian Jimenez  has presented today for cardiac catheterization, with the diagnosis of shortness of breath and abnormal stress test.  The various methods of treatment have been discussed with the patient and family. After consideration of risks, benefits and other options for treatment, the patient has consented to  Procedure(s): LEFT HEART CATH AND CORONARY ANGIOGRAPHY (N/A) as a surgical intervention .  The patient's history has been reviewed, patient examined, no change in status, stable for surgery.  I have reviewed the patient's chart and labs.  Questions were answered to the patient's satisfaction.    Cath Lab Visit (complete for each Cath Lab visit)  Clinical Evaluation Leading to the Procedure:   ACS: No.  Non-ACS:    Anginal Classification: CCS II  Anti-ischemic medical therapy: No Therapy  Non-Invasive Test Results: Intermediate-risk stress test findings: cardiac mortality 1-3%/year  Prior CABG: No previous CABG  [name]

## 2016-10-03 NOTE — Brief Op Note (Signed)
BRIEF CARDIAC CATHETERIZATION NOTE  DATE: 10/03/2016 TIME: 3:01 PM  PATIENT:  Tina Griffiths  71 y.o. female  PRE-OPERATIVE DIAGNOSIS:  abnormal stress test  POST-OPERATIVE DIAGNOSIS:  * No post-op diagnosis entered *  PROCEDURE:  Procedure(s): LEFT HEART CATH AND CORONARY ANGIOGRAPHY (N/A)  SURGEON:  Surgeon(s) and Role:    * Lumir Demetriou, MD - Primary  FINDINGS: 1. No significant stenosis involving the large epicardial coronary arteries. The distal branches taper and may reflect diffuse small vessel disease. 2. Hyperdynamic left ventricular contraction with upper normal left ventricular filling pressure.  RECOMMENDATIONS: 1. Medical therapy and risk factor modification. 2. Follow-up with Dr. Wynonia Lawman next week.  Nelva Bush, MD [facility] Pager: [phone]

## 2016-10-04 ENCOUNTER — Encounter (HOSPITAL_COMMUNITY): Payer: Self-pay | Admitting: Internal Medicine

## 2016-10-04 MED FILL — Lidocaine HCl Local Inj 2%: INTRAMUSCULAR | Qty: 10 | Status: AC

## 2016-11-12 DIAGNOSIS — K909 Intestinal malabsorption, unspecified: Secondary | ICD-10-CM | POA: Diagnosis not present

## 2016-11-12 DIAGNOSIS — I10 Essential (primary) hypertension: Secondary | ICD-10-CM | POA: Diagnosis not present

## 2016-11-12 DIAGNOSIS — K7581 Nonalcoholic steatohepatitis (NASH): Secondary | ICD-10-CM | POA: Diagnosis not present

## 2016-11-12 DIAGNOSIS — E559 Vitamin D deficiency, unspecified: Secondary | ICD-10-CM | POA: Diagnosis not present

## 2016-11-12 DIAGNOSIS — Z6839 Body mass index (BMI) 39.0-39.9, adult: Secondary | ICD-10-CM | POA: Diagnosis not present

## 2016-11-12 DIAGNOSIS — Z23 Encounter for immunization: Secondary | ICD-10-CM | POA: Diagnosis not present

## 2016-11-12 DIAGNOSIS — E039 Hypothyroidism, unspecified: Secondary | ICD-10-CM | POA: Diagnosis not present

## 2016-11-12 DIAGNOSIS — E8881 Metabolic syndrome: Secondary | ICD-10-CM | POA: Diagnosis not present

## 2016-11-12 DIAGNOSIS — E669 Obesity, unspecified: Secondary | ICD-10-CM | POA: Diagnosis not present

## 2016-12-23 DIAGNOSIS — K909 Intestinal malabsorption, unspecified: Secondary | ICD-10-CM | POA: Diagnosis not present

## 2016-12-23 DIAGNOSIS — K7581 Nonalcoholic steatohepatitis (NASH): Secondary | ICD-10-CM | POA: Diagnosis not present

## 2017-01-16 DIAGNOSIS — J209 Acute bronchitis, unspecified: Secondary | ICD-10-CM | POA: Diagnosis not present

## 2017-01-16 DIAGNOSIS — R509 Fever, unspecified: Secondary | ICD-10-CM | POA: Diagnosis not present

## 2017-01-16 DIAGNOSIS — J069 Acute upper respiratory infection, unspecified: Secondary | ICD-10-CM | POA: Diagnosis not present

## 2017-02-07 DIAGNOSIS — Z803 Family history of malignant neoplasm of breast: Secondary | ICD-10-CM | POA: Diagnosis not present

## 2017-02-07 DIAGNOSIS — Z1231 Encounter for screening mammogram for malignant neoplasm of breast: Secondary | ICD-10-CM | POA: Diagnosis not present

## 2017-03-25 DIAGNOSIS — G4733 Obstructive sleep apnea (adult) (pediatric): Secondary | ICD-10-CM | POA: Diagnosis not present

## 2017-03-26 DIAGNOSIS — M25561 Pain in right knee: Secondary | ICD-10-CM | POA: Diagnosis not present

## 2017-03-26 DIAGNOSIS — M79604 Pain in right leg: Secondary | ICD-10-CM | POA: Diagnosis not present

## 2017-04-09 DIAGNOSIS — M25572 Pain in left ankle and joints of left foot: Secondary | ICD-10-CM | POA: Diagnosis not present

## 2017-04-09 DIAGNOSIS — M25571 Pain in right ankle and joints of right foot: Secondary | ICD-10-CM | POA: Diagnosis not present

## 2017-04-17 DIAGNOSIS — E039 Hypothyroidism, unspecified: Secondary | ICD-10-CM | POA: Diagnosis not present

## 2017-04-17 DIAGNOSIS — R197 Diarrhea, unspecified: Secondary | ICD-10-CM | POA: Diagnosis not present

## 2017-04-17 DIAGNOSIS — E7800 Pure hypercholesterolemia, unspecified: Secondary | ICD-10-CM | POA: Diagnosis not present

## 2017-04-17 DIAGNOSIS — R5383 Other fatigue: Secondary | ICD-10-CM | POA: Diagnosis not present

## 2017-04-17 DIAGNOSIS — E559 Vitamin D deficiency, unspecified: Secondary | ICD-10-CM | POA: Diagnosis not present

## 2017-04-17 DIAGNOSIS — E119 Type 2 diabetes mellitus without complications: Secondary | ICD-10-CM | POA: Diagnosis not present

## 2017-04-17 DIAGNOSIS — E785 Hyperlipidemia, unspecified: Secondary | ICD-10-CM | POA: Diagnosis not present

## 2017-04-17 DIAGNOSIS — Z7984 Long term (current) use of oral hypoglycemic drugs: Secondary | ICD-10-CM | POA: Diagnosis not present

## 2017-04-17 DIAGNOSIS — Z658 Other specified problems related to psychosocial circumstances: Secondary | ICD-10-CM | POA: Diagnosis not present

## 2017-04-17 DIAGNOSIS — Z79899 Other long term (current) drug therapy: Secondary | ICD-10-CM | POA: Diagnosis not present

## 2017-04-17 DIAGNOSIS — M25579 Pain in unspecified ankle and joints of unspecified foot: Secondary | ICD-10-CM | POA: Diagnosis not present

## 2017-04-17 DIAGNOSIS — M5136 Other intervertebral disc degeneration, lumbar region: Secondary | ICD-10-CM | POA: Diagnosis not present

## 2017-05-02 DIAGNOSIS — M79604 Pain in right leg: Secondary | ICD-10-CM | POA: Diagnosis not present

## 2017-05-06 DIAGNOSIS — M25561 Pain in right knee: Secondary | ICD-10-CM | POA: Diagnosis not present

## 2017-05-06 DIAGNOSIS — M79604 Pain in right leg: Secondary | ICD-10-CM | POA: Diagnosis not present

## 2017-08-11 DIAGNOSIS — E559 Vitamin D deficiency, unspecified: Secondary | ICD-10-CM | POA: Diagnosis not present

## 2017-08-11 DIAGNOSIS — E7800 Pure hypercholesterolemia, unspecified: Secondary | ICD-10-CM | POA: Diagnosis not present

## 2017-09-25 DIAGNOSIS — E7800 Pure hypercholesterolemia, unspecified: Secondary | ICD-10-CM | POA: Diagnosis not present

## 2017-09-25 DIAGNOSIS — E039 Hypothyroidism, unspecified: Secondary | ICD-10-CM | POA: Diagnosis not present

## 2017-09-25 DIAGNOSIS — Z85828 Personal history of other malignant neoplasm of skin: Secondary | ICD-10-CM | POA: Diagnosis not present

## 2017-09-25 DIAGNOSIS — Z23 Encounter for immunization: Secondary | ICD-10-CM | POA: Diagnosis not present

## 2017-09-25 DIAGNOSIS — E559 Vitamin D deficiency, unspecified: Secondary | ICD-10-CM | POA: Diagnosis not present

## 2017-09-25 DIAGNOSIS — Z0000 Encounter for general adult medical examination without abnormal findings: Secondary | ICD-10-CM | POA: Diagnosis not present

## 2017-09-25 DIAGNOSIS — G4733 Obstructive sleep apnea (adult) (pediatric): Secondary | ICD-10-CM | POA: Diagnosis not present

## 2017-09-25 DIAGNOSIS — K7581 Nonalcoholic steatohepatitis (NASH): Secondary | ICD-10-CM | POA: Diagnosis not present

## 2017-09-25 DIAGNOSIS — E119 Type 2 diabetes mellitus without complications: Secondary | ICD-10-CM | POA: Diagnosis not present

## 2017-09-25 DIAGNOSIS — K219 Gastro-esophageal reflux disease without esophagitis: Secondary | ICD-10-CM | POA: Diagnosis not present

## 2017-09-25 DIAGNOSIS — Z6841 Body Mass Index (BMI) 40.0 and over, adult: Secondary | ICD-10-CM | POA: Diagnosis not present

## 2017-09-25 DIAGNOSIS — Z1159 Encounter for screening for other viral diseases: Secondary | ICD-10-CM | POA: Diagnosis not present

## 2017-09-25 DIAGNOSIS — R0989 Other specified symptoms and signs involving the circulatory and respiratory systems: Secondary | ICD-10-CM | POA: Diagnosis not present

## 2017-09-25 DIAGNOSIS — Z85820 Personal history of malignant melanoma of skin: Secondary | ICD-10-CM | POA: Diagnosis not present

## 2017-10-08 ENCOUNTER — Other Ambulatory Visit: Payer: Self-pay | Admitting: Family Medicine

## 2017-10-08 DIAGNOSIS — R0989 Other specified symptoms and signs involving the circulatory and respiratory systems: Secondary | ICD-10-CM

## 2017-10-09 DIAGNOSIS — D1801 Hemangioma of skin and subcutaneous tissue: Secondary | ICD-10-CM | POA: Diagnosis not present

## 2017-10-09 DIAGNOSIS — L814 Other melanin hyperpigmentation: Secondary | ICD-10-CM | POA: Diagnosis not present

## 2017-10-09 DIAGNOSIS — Z85820 Personal history of malignant melanoma of skin: Secondary | ICD-10-CM | POA: Diagnosis not present

## 2017-10-09 DIAGNOSIS — L821 Other seborrheic keratosis: Secondary | ICD-10-CM | POA: Diagnosis not present

## 2017-10-09 DIAGNOSIS — D225 Melanocytic nevi of trunk: Secondary | ICD-10-CM | POA: Diagnosis not present

## 2017-10-13 DIAGNOSIS — Z01419 Encounter for gynecological examination (general) (routine) without abnormal findings: Secondary | ICD-10-CM | POA: Diagnosis not present

## 2017-10-13 DIAGNOSIS — Z6841 Body Mass Index (BMI) 40.0 and over, adult: Secondary | ICD-10-CM | POA: Diagnosis not present

## 2017-10-13 DIAGNOSIS — Z809 Family history of malignant neoplasm, unspecified: Secondary | ICD-10-CM | POA: Diagnosis not present

## 2017-10-15 ENCOUNTER — Ambulatory Visit
Admission: RE | Admit: 2017-10-15 | Discharge: 2017-10-15 | Disposition: A | Discharge status: Home / Self Care | Payer: Medicare Other | Source: Ambulatory Visit | Attending: Family Medicine | Admitting: Family Medicine

## 2017-10-15 DIAGNOSIS — I6523 Occlusion and stenosis of bilateral carotid arteries: Secondary | ICD-10-CM | POA: Diagnosis not present

## 2017-10-15 DIAGNOSIS — R0989 Other specified symptoms and signs involving the circulatory and respiratory systems: Secondary | ICD-10-CM

## 2017-10-20 ENCOUNTER — Other Ambulatory Visit: Payer: Self-pay | Admitting: Obstetrics & Gynecology

## 2017-10-20 DIAGNOSIS — Z1231 Encounter for screening mammogram for malignant neoplasm of breast: Secondary | ICD-10-CM

## 2017-11-11 DIAGNOSIS — G4733 Obstructive sleep apnea (adult) (pediatric): Secondary | ICD-10-CM | POA: Diagnosis not present

## 2017-11-11 DIAGNOSIS — K7581 Nonalcoholic steatohepatitis (NASH): Secondary | ICD-10-CM | POA: Diagnosis not present

## 2017-11-11 DIAGNOSIS — E559 Vitamin D deficiency, unspecified: Secondary | ICD-10-CM | POA: Diagnosis not present

## 2017-11-11 DIAGNOSIS — R7303 Prediabetes: Secondary | ICD-10-CM | POA: Diagnosis not present

## 2017-11-11 DIAGNOSIS — I10 Essential (primary) hypertension: Secondary | ICD-10-CM | POA: Diagnosis not present

## 2017-11-11 DIAGNOSIS — K219 Gastro-esophageal reflux disease without esophagitis: Secondary | ICD-10-CM | POA: Diagnosis not present

## 2017-11-19 ENCOUNTER — Ambulatory Visit: Payer: Federal, State, Local not specified - PPO

## 2017-12-16 DIAGNOSIS — R1011 Right upper quadrant pain: Secondary | ICD-10-CM | POA: Diagnosis not present

## 2017-12-18 ENCOUNTER — Other Ambulatory Visit: Payer: Self-pay | Admitting: Family Medicine

## 2017-12-18 DIAGNOSIS — R1011 Right upper quadrant pain: Secondary | ICD-10-CM

## 2017-12-19 ENCOUNTER — Ambulatory Visit
Admission: RE | Admit: 2017-12-19 | Discharge: 2017-12-19 | Disposition: A | Discharge status: Home / Self Care | Payer: Medicare Other | Source: Ambulatory Visit | Attending: Family Medicine | Admitting: Family Medicine

## 2017-12-19 DIAGNOSIS — R1011 Right upper quadrant pain: Secondary | ICD-10-CM | POA: Diagnosis not present

## 2017-12-22 DIAGNOSIS — E119 Type 2 diabetes mellitus without complications: Secondary | ICD-10-CM | POA: Diagnosis not present

## 2017-12-22 DIAGNOSIS — E7800 Pure hypercholesterolemia, unspecified: Secondary | ICD-10-CM | POA: Diagnosis not present

## 2017-12-22 DIAGNOSIS — E039 Hypothyroidism, unspecified: Secondary | ICD-10-CM | POA: Diagnosis not present

## 2017-12-22 DIAGNOSIS — R1011 Right upper quadrant pain: Secondary | ICD-10-CM | POA: Diagnosis not present

## 2017-12-22 DIAGNOSIS — E559 Vitamin D deficiency, unspecified: Secondary | ICD-10-CM | POA: Diagnosis not present

## 2017-12-25 ENCOUNTER — Other Ambulatory Visit (HOSPITAL_COMMUNITY): Payer: Self-pay | Admitting: Gastroenterology

## 2017-12-25 DIAGNOSIS — Z800 Family history of malignant neoplasm of digestive organs: Secondary | ICD-10-CM | POA: Diagnosis not present

## 2017-12-25 DIAGNOSIS — R10821 Right upper quadrant rebound abdominal tenderness: Secondary | ICD-10-CM

## 2018-01-02 ENCOUNTER — Ambulatory Visit (HOSPITAL_COMMUNITY)
Admission: RE | Admit: 2018-01-02 | Discharge: 2018-01-02 | Disposition: A | Discharge status: Home / Self Care | Payer: Medicare Other | Source: Ambulatory Visit | Attending: Gastroenterology | Admitting: Gastroenterology

## 2018-01-02 ENCOUNTER — Encounter (HOSPITAL_COMMUNITY): Payer: Self-pay | Admitting: Radiology

## 2018-01-02 DIAGNOSIS — K828 Other specified diseases of gallbladder: Secondary | ICD-10-CM | POA: Diagnosis not present

## 2018-01-02 DIAGNOSIS — R10821 Right upper quadrant rebound abdominal tenderness: Secondary | ICD-10-CM | POA: Insufficient documentation

## 2018-01-02 MED ORDER — TECHNETIUM TC 99M MEBROFENIN IV KIT
5.0000 | PACK | Freq: Once | INTRAVENOUS | Status: AC | PRN
  Administered 2018-01-02: 5 via INTRAVENOUS

## 2018-01-28 ENCOUNTER — Ambulatory Visit: Payer: Self-pay | Admitting: General Surgery

## 2018-01-28 DIAGNOSIS — K828 Other specified diseases of gallbladder: Secondary | ICD-10-CM | POA: Diagnosis not present

## 2018-02-10 NOTE — Patient Instructions (Addendum)
Jillian Jimenez  02/10/2018   Your procedure is scheduled on: 02-16-18  Report to [facility] Main  Entrance             Report to admitting at        0800  AM    Call this number if you have problems the morning of surgery [phone]    Remember: Do not eat food:After Midnight.NO SOLID FOOD AFTER MIDNIGHT THE NIGHT PRIOR TO SURGERY. NOTHING BY MOUTH EXCEPT CLEAR LIQUIDS UNTIL 3 HOURS PRIOR TO [place] SURGERY. PLEASE FINISH ENSURE DRINK PER SURGEON ORDER 3 HOURS PRIOR TO SCHEDULED SURGERY TIME WHICH NEEDS TO BE COMPLETED AT ___0700 am then nothing by mouth_________.    BRUSH YOUR TEETH MORNING OF SURGERY AND RINSE YOUR MOUTH OUT, NO CHEWING GUM CANDY OR MINTS.     Take these medicines the morning of surgery with A SIP OF WATER: levothyroxine, prevacid, zyrtec DO NOT TAKE ANY DIABETIC MEDICATIONS DAY OF YOUR SURGERY                               You may not have any metal on your body including hair pins and              piercings  Do not wear jewelry, make-up, lotions, powders or perfumes, deodorant             Do not wear nail polish.  Do not shave  48 hours prior to surgery.     Do not bring valuables to the hospital. [place].  Contacts, dentures or bridgework may not be worn into surgery.       Patients discharged the day of surgery will not be allowed to drive home. IF YOU ARE HAVING SURGERY AND GOING HOME THE SAME DAY, YOU MUST HAVE AN ADULT TO DRIVE YOU HOME AND BE WITH YOU FOR 24 HOURS. YOU MAY GO HOME BY TAXI OR UBER OR ORTHERWISE, BUT AN ADULT MUST ACCOMPANY YOU HOME AND STAY WITH YOU FOR 24 HOURS.  Name and phone number of your driver:  Special Instructions: N/A              Please read over the following fact sheets you were given: _____________________________________________________________________             [facility] - Preparing for Surgery Before surgery, you can play an important  role.  Because skin is not sterile, your skin needs to be as free of germs as possible.  You can reduce the number of germs on your skin by washing with CHG (chlorahexidine gluconate) soap before surgery.  CHG is an antiseptic cleaner which kills germs and bonds with the skin to continue killing germs even after washing. Please DO NOT use if you have an allergy to CHG or antibacterial soaps.  If your skin becomes reddened/irritated stop using the CHG and inform your nurse when you arrive at Short Stay. Do not shave (including legs and underarms) for at least 48 hours prior to the first CHG shower.  You may shave your face/neck. Please follow these instructions carefully:  1.  Shower with CHG Soap the night before surgery and the  morning of Surgery.  2.  If you choose to wash your  hair, wash your hair first as usual with your  normal  shampoo.  3.  After you shampoo, rinse your hair and body thoroughly to remove the  shampoo.                           4.  Use CHG as you would any other liquid soap.  You can apply chg directly  to the skin and wash                       Gently with a scrungie or clean washcloth.  5.  Apply the CHG Soap to your body ONLY FROM THE NECK DOWN.   Do not use on face/ open                           Wound or open sores. Avoid contact with eyes, ears mouth and genitals (private parts).                       Wash face,  Genitals (private parts) with your normal soap.             6.  Wash thoroughly, paying special attention to the area where your surgery  will be performed.  7.  Thoroughly rinse your body with warm water from the neck down.  8.  DO NOT shower/wash with your normal soap after using and rinsing off  the CHG Soap.                9.  Pat yourself dry with a clean towel.            10.  Wear clean pajamas.            11.  Place clean sheets on your bed the night of your first shower and do not  sleep with pets. Day of Surgery : Do not apply any lotions/deodorants  the morning of surgery.  Please wear clean clothes to the hospital/surgery center.  FAILURE TO FOLLOW THESE INSTRUCTIONS MAY RESULT IN THE CANCELLATION OF YOUR SURGERY PATIENT SIGNATURE_________________________________  NURSE SIGNATURE__________________________________  ________________________________________________________________________

## 2018-02-11 ENCOUNTER — Other Ambulatory Visit: Payer: Self-pay

## 2018-02-11 ENCOUNTER — Encounter (HOSPITAL_COMMUNITY)
Admission: RE | Admit: 2018-02-11 | Discharge: 2018-02-11 | Disposition: A | Discharge status: Home / Self Care | Payer: Medicare Other | Source: Ambulatory Visit | Attending: General Surgery | Admitting: General Surgery

## 2018-02-11 ENCOUNTER — Encounter (HOSPITAL_COMMUNITY): Payer: Self-pay

## 2018-02-11 DIAGNOSIS — Z01818 Encounter for other preprocedural examination: Secondary | ICD-10-CM | POA: Diagnosis not present

## 2018-02-11 HISTORY — DX: Cyst of kidney, acquired: N28.1

## 2018-02-11 HISTORY — DX: Pneumonia, unspecified organism: J18.9

## 2018-02-11 HISTORY — DX: Nausea with vomiting, unspecified: R11.2

## 2018-02-11 HISTORY — DX: Other complications of anesthesia, initial encounter: T88.59XA

## 2018-02-11 HISTORY — DX: Other specified postprocedural states: Z98.890

## 2018-02-11 HISTORY — DX: Adverse effect of unspecified anesthetic, initial encounter: T41.45XA

## 2018-02-11 LAB — COMPREHENSIVE METABOLIC PANEL
ALT: 13 U/L (ref 0–44)
AST: 21 U/L (ref 15–41)
Albumin: 4.2 g/dL (ref 3.5–5.0)
Alkaline Phosphatase: 56 U/L (ref 38–126)
Anion gap: 9 (ref 5–15)
BUN: 17 mg/dL (ref 8–23)
CHLORIDE: 108 mmol/L (ref 98–111)
CO2: 24 mmol/L (ref 22–32)
Calcium: 9.2 mg/dL (ref 8.9–10.3)
Creatinine, Ser: 0.81 mg/dL (ref 0.44–1.00)
GFR calc Af Amer: >60 mL/min (ref >60)
GFR calc non Af Amer: >60 mL/min (ref >60)
Glucose, Bld: 119 mg/dL — ABNORMAL HIGH (ref 70–99)
POTASSIUM: 4.4 mmol/L (ref 3.5–5.1)
SODIUM: 141 mmol/L (ref 135–145)
Total Bilirubin: 0.5 mg/dL (ref 0.3–1.2)
Total Protein: 7.4 g/dL (ref 6.5–8.1)

## 2018-02-11 LAB — CBC WITH DIFFERENTIAL/PLATELET
Abs Immature Granulocytes: 0.02 10*3/uL (ref 0.00–0.07)
Basophils Absolute: 0 10*3/uL (ref 0.0–0.1)
Basophils Relative: 0 %
Eosinophils Absolute: 0.3 10*3/uL (ref 0.0–0.5)
Eosinophils Relative: 6 %
HCT: 40.6 % (ref 36.0–46.0)
Hemoglobin: 12.5 g/dL (ref 12.0–15.0)
IMMATURE GRANULOCYTES: 0 %
Lymphocytes Relative: 38 %
Lymphs Abs: 2.3 10*3/uL (ref 0.7–4.0)
MCH: 30.5 pg (ref 26.0–34.0)
MCHC: 30.8 g/dL (ref 30.0–36.0)
MCV: 99 fL (ref 80.0–100.0)
Monocytes Absolute: 0.3 10*3/uL (ref 0.1–1.0)
Monocytes Relative: 6 %
NEUTROS PCT: 50 %
Neutro Abs: 3.1 10*3/uL (ref 1.7–7.7)
PLATELETS: 178 10*3/uL (ref 150–400)
RBC: 4.1 MIL/uL (ref 3.87–5.11)
RDW: 13.7 % (ref 11.5–15.5)
WBC: 6.1 10*3/uL (ref 4.0–10.5)
nRBC: 0 % (ref 0.0–0.2)

## 2018-02-11 LAB — HEMOGLOBIN A1C
Hgb A1c MFr Bld: 5.4 % (ref 4.8–5.6)
Mean Plasma Glucose: 108.28 mg/dL

## 2018-02-11 NOTE — Progress Notes (Signed)
Cath 10-03-16 epic

## 2018-02-13 NOTE — H&P (Signed)
History of Present Illness Marland Kitchen T. Takeela Peil MD; 01/28/2018 4:24 PM) The patient is a 73 year old female who presents with non-malignant abdominal pain. Patient is a pleasant 73 year old female referred by Dr. Therisa Doyne for recurrent right upper quadrant abdominal pain and possible biliary dyskinesia. The patient states she was in her usual state of health until about 2 months ago. Since then she has been having frequent episodes of what she describes as sharp and pressure-like right subcostal pain. This is clearly related to meals. It occurs about one half hour after eating. It is associated with nausea. At times the pain has been very severe. It tends to radiate across to the epigastrium and somewhat to the left upper quadrant. She has been able to improve the pain but not resolve by eating a fairly strict low-fat diet. No vomiting. She has some mild chronic IBS-like symptoms that are unchanged. 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 Patient has a history of NASH and elevated LFTs following statins that have resolved. She has a number of moderate medical issues including obesity, obstructive sleep apnea and mild diabetes. She had a cardiac catheterization last year which showed normal function and mild distal vessel irregularity. She has mild reflux controlled with medications and the symptoms are very different. She has not had a recent endoscopy.   Past Surgical History Emeline Gins, [place]; 01/28/2018 3:58 PM) Cesarean Section - 1  Foot Surgery  Bilateral. Hysterectomy (not due to cancer) - Complete  Oral Surgery   Diagnostic Studies History Emeline Gins, [place]; 01/28/2018 3:58 PM) Colonoscopy  5-10 years ago Mammogram  within last year Pap Smear  1-5 years ago  Allergies Emeline Gins, CMA; 01/28/2018 4:01 PM) Latex  Sulfa  Antibiotics  Clindamycin  Keflex *CEPHALOSPORINS*  Phenergan *ANTIHISTAMINES*  Plavix *HEMATOLOGICAL AGENTS - MISC.*  Amoxicillin-Pot Clavulanate *CHEMICALS*  Allergies Reconciled   Medication History Emeline Gins, CMA; 01/28/2018 4:02 PM) Benazepril HCl (10MG  Tablet, Oral) Active. Levothyroxine Sodium (25MCG Tablet, Oral) Active. Pioglitazone HCl (15MG  Tablet, Oral) Active. ZyrTEC Allergy (10MG  Capsule, Oral) Active. Lansoprazole (30MG  Capsule ER, Oral) Active. Medications Reconciled  Social History Emeline Gins, [place]; 01/28/2018 3:58 PM) Alcohol use  Occasional alcohol use. Caffeine use  Coffee. No drug use  Tobacco use  Never smoker.  Family History Emeline Gins, [place]; 01/28/2018 3:58 PM) Arthritis  Daughter. Bleeding disorder  Sister. Breast Cancer  Family Members In General, Sister. Cancer  Family Members In General, Mother. Colon Polyps  Sister. Diabetes Mellitus  Sister. Heart disease in female family member before age 37  Hypertension  Sister. Ischemic Bowel Disease  Daughter. Melanoma  Sister. Migraine Headache  Daughter. Respiratory Condition  Sister.  Pregnancy / Birth History Emeline Gins, [place]; 01/28/2018 3:58 PM) Age of menopause  51-55 Contraceptive History  Oral contraceptives. Gravida  2 Maternal age  59-30 Para  2  Other Problems Emeline Gins, [place]; 01/28/2018 3:58 PM) Back Pain  Diabetes Mellitus  Gastroesophageal Reflux Disease  High blood pressure  Hypercholesterolemia  Melanoma  Oophorectomy  Bilateral. Other disease, cancer, significant illness  Sleep Apnea  Thyroid Disease     Review of Systems Emeline Gins CMA; 01/28/2018 3:58 PM) General Not Present- Appetite Loss, Chills, Fatigue, Fever, Night Sweats, Weight Gain and Weight Loss. Skin Not Present- Change in Wart/Mole, Dryness, Hives, Jaundice, New Lesions, Non-Healing Wounds, Rash and Ulcer. HEENT Present- Ringing in the  Ears,  Seasonal Allergies and Wears glasses/contact lenses. Not Present- Earache, Hearing Loss, Hoarseness, Nose Bleed, Oral Ulcers, Sinus Pain, Sore Throat, Visual Disturbances and Yellow Eyes. Respiratory Present- Snoring. Not Present- Bloody sputum, Chronic Cough, Difficulty Breathing and Wheezing. Cardiovascular Present- Leg Cramps. Not Present- Chest Pain, Difficulty Breathing Lying Down, Palpitations, Rapid Heart Rate, Shortness of Breath and Swelling of Extremities. Gastrointestinal Present- Abdominal Pain and Nausea. Not Present- Bloating, Bloody Stool, Change in Bowel Habits, Chronic diarrhea, Constipation, Difficulty Swallowing, Excessive gas, Gets full quickly at meals, Hemorrhoids, Indigestion, Rectal Pain and Vomiting. Female Genitourinary Not Present- Frequency, Nocturia, Painful Urination, Pelvic Pain and Urgency. Musculoskeletal Not Present- Back Pain, Joint Pain, Joint Stiffness, Muscle Pain, Muscle Weakness and Swelling of Extremities. Neurological Not Present- Decreased Memory, Fainting, Headaches, Numbness, Seizures, Tingling, Tremor, Trouble walking and Weakness. Psychiatric Not Present- Anxiety, Bipolar, Change in Sleep Pattern, Depression, Fearful and Frequent crying. Endocrine Not Present- Cold Intolerance, Excessive Hunger, Hair Changes, Heat Intolerance, Hot flashes and New Diabetes. Hematology Not Present- Blood Thinners, Easy Bruising, Excessive bleeding, Gland problems, HIV and Persistent Infections.  Vitals Emeline Gins CMA; 01/28/2018 3:59 PM) 01/28/2018 3:59 PM Weight: 193 lb Height: 58in Body Surface Area: 1.79 m Body Mass Index: 40.34 kg/m  Temp.: 98.19F  Pulse: 74 (Regular)  BP: 122/68 (Sitting, Left Arm, Standard)       Physical Exam Marland Kitchen T. Somalia Segler MD; 01/28/2018 4:25 PM) The physical exam findings are as follows: Note:General: Alert, pleasant obese Caucasian female, in no distress Skin: Warm and dry without rash or  infection. HEENT: No palpable masses or thyromegaly. Sclera nonicteric. Pupils equal round and reactive. Lymph nodes: No cervical, supraclavicular, nodes palpable. Lungs: Breath sounds clear and equal. No wheezing or increased work of breathing. Cardiovascular: Regular rate and rhythm without murmer. No JVD or edema. Peripheral pulses intact. No carotid bruits. Abdomen: Nondistended. Mild epigastric and right upper quadrant tenderness without guarding. No masses palpable. No organomegaly. No palpable hernias. Extremities: No edema or joint swelling or deformity. No chronic venous stasis changes. Neurologic: Alert and fully oriented. Gait normal. No focal weakness. Psychiatric: Normal mood and affect. Thought content appropriate with normal judgement and insight    Assessment & Plan Marland Kitchen T. Merian Wroe MD; 01/28/2018 4:43 PM) BILIARY DYSKINESIA (K82.8) Impression: Pleasant 73 year old female in her usual state of health until the past 2 months. Since then she has developed frequent very typical biliary type symptoms with right upper quadrant pain and nausea related to fatty meals. Her symptoms are severe and not tolerable. Workup includes a negative abdominal ultrasound and HIDA scan showing reduced ejection fraction at 21%. Very suspicious for biliary dyskinesia. She has not had an upper endoscopy but her symptoms are so typical but I do not feel that this is necessary. She is at somewhat increased risk for surgery with obesity and sleep apnea but I believe acceptable risk. I discussed alternatives of proceeding with surgery versus further workup with endoscopy and a period of observation. However I think her symptoms are classic and I think she has a very good chance of relief with cholecystectomy. After discussion she has elected to proceed with cholecystectomy. I discussed the procedure in detail. The patient was given Neurosurgeon. We discussed the risks and benefits of a laparoscopic  cholecystectomy and possible cholangiogram including, but not limited to, bleeding, infection, injury to surrounding structures such as the intestine or liver, bile leak, need to convert to an open procedure, prolonged diarrhea, blood clots such as DVT, common bile duct injury, anesthesia risks, and  possible need for additional procedures. The likelihood of improvement in symptoms and return to the patient's normal status is good. We discussed the typical post-operative recovery course. All questions were answered. Current Plans Laparoscopic cholecystectomy with intraoperative cholangiogram under general anesthesia as an outpatient

## 2018-02-13 NOTE — Progress Notes (Signed)
Final EKG dated 02-11-2018 in epic.

## 2018-02-16 ENCOUNTER — Encounter (HOSPITAL_COMMUNITY)
Admission: RE | Disposition: A | Discharge status: Home / Self Care | Payer: Self-pay | Source: Home / Self Care | Attending: General Surgery

## 2018-02-16 ENCOUNTER — Ambulatory Visit (HOSPITAL_COMMUNITY): Payer: Medicare Other | Admitting: Certified Registered"

## 2018-02-16 ENCOUNTER — Encounter (HOSPITAL_COMMUNITY): Payer: Self-pay | Admitting: *Deleted

## 2018-02-16 ENCOUNTER — Ambulatory Visit (HOSPITAL_COMMUNITY)
Admission: RE | Admit: 2018-02-16 | Discharge: 2018-02-16 | Disposition: A | Discharge status: Home / Self Care | Payer: Medicare Other | Attending: General Surgery | Admitting: General Surgery

## 2018-02-16 ENCOUNTER — Ambulatory Visit (HOSPITAL_COMMUNITY): Payer: Medicare Other

## 2018-02-16 ENCOUNTER — Ambulatory Visit (HOSPITAL_COMMUNITY): Payer: Medicare Other | Admitting: Physician Assistant

## 2018-02-16 DIAGNOSIS — E119 Type 2 diabetes mellitus without complications: Secondary | ICD-10-CM | POA: Diagnosis not present

## 2018-02-16 DIAGNOSIS — Z91040 Latex allergy status: Secondary | ICD-10-CM | POA: Insufficient documentation

## 2018-02-16 DIAGNOSIS — G4730 Sleep apnea, unspecified: Secondary | ICD-10-CM | POA: Diagnosis not present

## 2018-02-16 DIAGNOSIS — Z888 Allergy status to other drugs, medicaments and biological substances status: Secondary | ICD-10-CM | POA: Insufficient documentation

## 2018-02-16 DIAGNOSIS — K828 Other specified diseases of gallbladder: Secondary | ICD-10-CM

## 2018-02-16 DIAGNOSIS — Z881 Allergy status to other antibiotic agents status: Secondary | ICD-10-CM | POA: Diagnosis not present

## 2018-02-16 DIAGNOSIS — K811 Chronic cholecystitis: Secondary | ICD-10-CM | POA: Diagnosis not present

## 2018-02-16 DIAGNOSIS — I10 Essential (primary) hypertension: Secondary | ICD-10-CM | POA: Diagnosis not present

## 2018-02-16 DIAGNOSIS — K219 Gastro-esophageal reflux disease without esophagitis: Secondary | ICD-10-CM | POA: Insufficient documentation

## 2018-02-16 DIAGNOSIS — Z79899 Other long term (current) drug therapy: Secondary | ICD-10-CM | POA: Insufficient documentation

## 2018-02-16 DIAGNOSIS — E039 Hypothyroidism, unspecified: Secondary | ICD-10-CM | POA: Diagnosis not present

## 2018-02-16 DIAGNOSIS — Z7984 Long term (current) use of oral hypoglycemic drugs: Secondary | ICD-10-CM | POA: Diagnosis not present

## 2018-02-16 DIAGNOSIS — Z882 Allergy status to sulfonamides status: Secondary | ICD-10-CM | POA: Diagnosis not present

## 2018-02-16 DIAGNOSIS — Z79890 Hormone replacement therapy (postmenopausal): Secondary | ICD-10-CM | POA: Diagnosis not present

## 2018-02-16 DIAGNOSIS — K8010 Calculus of gallbladder with chronic cholecystitis without obstruction: Secondary | ICD-10-CM | POA: Diagnosis not present

## 2018-02-16 HISTORY — PX: CHOLECYSTECTOMY: SHX55

## 2018-02-16 LAB — GLUCOSE, CAPILLARY
GLUCOSE-CAPILLARY: 122 mg/dL — AB (ref 70–99)
GLUCOSE-CAPILLARY: 144 mg/dL — AB (ref 70–99)

## 2018-02-16 SURGERY — LAPAROSCOPIC CHOLECYSTECTOMY WITH INTRAOPERATIVE CHOLANGIOGRAM
Anesthesia: General | Site: Abdomen

## 2018-02-16 MED ORDER — HYDROMORPHONE HCL 1 MG/ML IJ SOLN: 0.2500 mg | INTRAMUSCULAR | Status: DC | PRN

## 2018-02-16 MED ORDER — FENTANYL CITRATE (PF) 250 MCG/5ML IJ SOLN
INTRAMUSCULAR | Status: AC
  Filled 2018-02-16: qty 5

## 2018-02-16 MED ORDER — LIDOCAINE 2% (20 MG/ML) 5 ML SYRINGE
INTRAMUSCULAR | Status: AC
  Filled 2018-02-16: qty 5

## 2018-02-16 MED ORDER — CIPROFLOXACIN IN D5W 400 MG/200ML IV SOLN
400.0000 mg | INTRAVENOUS | Status: AC
  Administered 2018-02-16: 400 mg via INTRAVENOUS
  Filled 2018-02-16: qty 200

## 2018-02-16 MED ORDER — CHLORHEXIDINE GLUCONATE CLOTH 2 % EX PADS: 6.0000 | MEDICATED_PAD | Freq: Once | CUTANEOUS | Status: DC

## 2018-02-16 MED ORDER — IOPAMIDOL (ISOVUE-300) INJECTION 61%
INTRAVENOUS | Status: DC | PRN
  Administered 2018-02-16: 5 mL

## 2018-02-16 MED ORDER — ONDANSETRON HCL 4 MG/2ML IJ SOLN
INTRAMUSCULAR | Status: AC
  Filled 2018-02-16: qty 2

## 2018-02-16 MED ORDER — PROPOFOL 10 MG/ML IV BOLUS
INTRAVENOUS | Status: DC | PRN
  Administered 2018-02-16: 150 mg via INTRAVENOUS

## 2018-02-16 MED ORDER — 0.9 % SODIUM CHLORIDE (POUR BTL) OPTIME
TOPICAL | Status: DC | PRN
  Administered 2018-02-16: 1000 mL

## 2018-02-16 MED ORDER — PROPOFOL 10 MG/ML IV BOLUS
INTRAVENOUS | Status: AC
  Filled 2018-02-16: qty 20

## 2018-02-16 MED ORDER — LIDOCAINE 2% (20 MG/ML) 5 ML SYRINGE
INTRAMUSCULAR | Status: DC | PRN
  Administered 2018-02-16: 1.5 mg/kg/h via INTRAVENOUS

## 2018-02-16 MED ORDER — DEXAMETHASONE SODIUM PHOSPHATE 10 MG/ML IJ SOLN
INTRAMUSCULAR | Status: DC | PRN
  Administered 2018-02-16: 4 mg via INTRAVENOUS

## 2018-02-16 MED ORDER — BUPIVACAINE HCL (PF) 0.25 % IJ SOLN
INTRAMUSCULAR | Status: DC | PRN
  Administered 2018-02-16: 30 mL

## 2018-02-16 MED ORDER — FENTANYL CITRATE (PF) 250 MCG/5ML IJ SOLN
INTRAMUSCULAR | Status: DC | PRN
  Administered 2018-02-16: 100 ug via INTRAVENOUS
  Administered 2018-02-16 (×3): 50 ug via INTRAVENOUS

## 2018-02-16 MED ORDER — DEXAMETHASONE SODIUM PHOSPHATE 10 MG/ML IJ SOLN
INTRAMUSCULAR | Status: AC
  Filled 2018-02-16: qty 1

## 2018-02-16 MED ORDER — ROCURONIUM BROMIDE 10 MG/ML (PF) SYRINGE
PREFILLED_SYRINGE | INTRAVENOUS | Status: DC | PRN
  Administered 2018-02-16: 50 mg via INTRAVENOUS

## 2018-02-16 MED ORDER — LIDOCAINE HCL 2 % IJ SOLN
INTRAMUSCULAR | Status: AC
  Filled 2018-02-16: qty 20

## 2018-02-16 MED ORDER — OXYCODONE HCL 5 MG PO TABS: 5.0000 mg | ORAL_TABLET | Freq: Four times a day (QID) | ORAL | 0 refills | Status: DC | PRN

## 2018-02-16 MED ORDER — IOPAMIDOL (ISOVUE-300) INJECTION 61%
INTRAVENOUS | Status: AC
  Filled 2018-02-16: qty 50

## 2018-02-16 MED ORDER — ONDANSETRON HCL 4 MG/2ML IJ SOLN
INTRAMUSCULAR | Status: DC | PRN
  Administered 2018-02-16: 4 mg via INTRAVENOUS

## 2018-02-16 MED ORDER — GABAPENTIN 300 MG PO CAPS
300.0000 mg | ORAL_CAPSULE | ORAL | Status: AC
  Administered 2018-02-16: 300 mg via ORAL
  Filled 2018-02-16: qty 1

## 2018-02-16 MED ORDER — PROPOFOL 500 MG/50ML IV EMUL
INTRAVENOUS | Status: DC | PRN
  Administered 2018-02-16: 25 ug/kg/min via INTRAVENOUS

## 2018-02-16 MED ORDER — SUGAMMADEX SODIUM 200 MG/2ML IV SOLN
INTRAVENOUS | Status: DC | PRN
  Administered 2018-02-16: 180 mg via INTRAVENOUS

## 2018-02-16 MED ORDER — SUGAMMADEX SODIUM 200 MG/2ML IV SOLN
INTRAVENOUS | Status: AC
  Filled 2018-02-16: qty 2

## 2018-02-16 MED ORDER — ACETAMINOPHEN 500 MG PO TABS
1000.0000 mg | ORAL_TABLET | ORAL | Status: AC
  Administered 2018-02-16: 1000 mg via ORAL
  Filled 2018-02-16: qty 2

## 2018-02-16 MED ORDER — LIDOCAINE 2% (20 MG/ML) 5 ML SYRINGE
INTRAMUSCULAR | Status: DC | PRN
  Administered 2018-02-16: 60 mg via INTRAVENOUS

## 2018-02-16 MED ORDER — ROCURONIUM BROMIDE 100 MG/10ML IV SOLN
INTRAVENOUS | Status: AC
  Filled 2018-02-16: qty 1

## 2018-02-16 MED ORDER — LACTATED RINGERS IV SOLN
INTRAVENOUS | Status: DC
  Administered 2018-02-16: 09:00:00 via INTRAVENOUS

## 2018-02-16 MED ORDER — LACTATED RINGERS IR SOLN
Status: DC | PRN
  Administered 2018-02-16: 1000 mL

## 2018-02-16 MED ORDER — MEPERIDINE HCL 50 MG/ML IJ SOLN: 6.2500 mg | INTRAMUSCULAR | Status: DC | PRN

## 2018-02-16 MED ORDER — ONDANSETRON HCL 4 MG/2ML IJ SOLN
4.0000 mg | Freq: Once | INTRAMUSCULAR | Status: AC | PRN
  Administered 2018-02-16: 4 mg via INTRAVENOUS

## 2018-02-16 MED ORDER — BUPIVACAINE HCL (PF) 0.25 % IJ SOLN
INTRAMUSCULAR | Status: AC
  Filled 2018-02-16: qty 30

## 2018-02-16 SURGICAL SUPPLY — 34 items
APPLIER CLIP ROT 10 11.4 M/L (STAPLE) ×2
CABLE HIGH FREQUENCY MONO STRZ (ELECTRODE) ×2 IMPLANT
CATH REDDICK CHOLANGI 4FR 50CM (CATHETERS) IMPLANT
CHLORAPREP W/TINT 26ML (MISCELLANEOUS) ×2 IMPLANT
CLIP APPLIE ROT 10 11.4 M/L (STAPLE) ×1 IMPLANT
COVER MAYO STAND STRL (DRAPES) ×2 IMPLANT
COVER SURGICAL LIGHT HANDLE (MISCELLANEOUS) ×2 IMPLANT
COVER WAND RF STERILE (DRAPES) IMPLANT
DECANTER SPIKE VIAL GLASS SM (MISCELLANEOUS) ×2 IMPLANT
DERMABOND ADVANCED (GAUZE/BANDAGES/DRESSINGS) ×1
DERMABOND ADVANCED .7 DNX12 (GAUZE/BANDAGES/DRESSINGS) ×1 IMPLANT
DRAPE C-ARM 42X120 X-RAY (DRAPES) ×2 IMPLANT
ELECT REM PT RETURN 15FT ADLT (MISCELLANEOUS) ×2 IMPLANT
FILTER SMOKE EVAC LAPAROSHD (FILTER) ×2 IMPLANT
GLOVE BIOGEL PI IND STRL 7.5 (GLOVE) ×1 IMPLANT
GLOVE BIOGEL PI INDICATOR 7.5 (GLOVE) ×1
GLOVE ECLIPSE 7.5 STRL STRAW (GLOVE) ×2 IMPLANT
GOWN STRL REUS W/TWL XL LVL3 (GOWN DISPOSABLE) ×6 IMPLANT
HEMOSTAT SNOW SURGICEL 2X4 (HEMOSTASIS) IMPLANT
HEMOSTAT SURGICEL 4X8 (HEMOSTASIS) IMPLANT
KIT BASIN OR (CUSTOM PROCEDURE TRAY) ×2 IMPLANT
POUCH RETRIEVAL ECOSAC 10 (ENDOMECHANICALS) IMPLANT
POUCH RETRIEVAL ECOSAC 10MM (ENDOMECHANICALS)
SCISSORS LAP 5X35 DISP (ENDOMECHANICALS) ×2 IMPLANT
SET CHOLANGIOGRAPH MIX (MISCELLANEOUS) ×2 IMPLANT
SET IRRIG TUBING LAPAROSCOPIC (IRRIGATION / IRRIGATOR) ×2 IMPLANT
SET TUBE SMOKE EVAC HIGH FLOW (TUBING) ×2 IMPLANT
SLEEVE XCEL OPT CAN 5 100 (ENDOMECHANICALS) ×2 IMPLANT
SUT MNCRL AB 4-0 PS2 18 (SUTURE) ×2 IMPLANT
TOWEL OR 17X26 10 PK STRL BLUE (TOWEL DISPOSABLE) ×2 IMPLANT
TRAY LAPAROSCOPIC (CUSTOM PROCEDURE TRAY) ×2 IMPLANT
TROCAR BLADELESS OPT 5 100 (ENDOMECHANICALS) ×2 IMPLANT
TROCAR XCEL BLUNT TIP 100MML (ENDOMECHANICALS) ×2 IMPLANT
TROCAR XCEL NON-BLD 11X100MML (ENDOMECHANICALS) ×2 IMPLANT

## 2018-02-16 NOTE — Discharge Instructions (Signed)
[facility] ______[facility] °LAPAROSCOPIC SURGERY: POST OP INSTRUCTIONS °Always review your discharge instruction sheet given to you by the facility where your surgery was performed. °IF YOU HAVE DISABILITY OR FAMILY LEAVE FORMS, YOU MUST BRING THEM TO THE OFFICE FOR PROCESSING.   °DO NOT GIVE THEM TO YOUR DOCTOR. ° °1. A prescription for pain medication may be given to you upon discharge.  Take your pain medication as prescribed, if needed.  If narcotic pain medicine is not needed, then you may take acetaminophen (Tylenol) or ibuprofen (Advil) as needed. °2. Take your usually prescribed medications unless otherwise directed. °3. If you need a refill on your pain medication, please contact your pharmacy.  They will contact our office to request authorization. Prescriptions will not be filled after 5pm or on week-ends. °4. You should follow a light diet the first few days after arrival home, such as soup and crackers, etc.  Be sure to include lots of fluids daily. °5. Most patients will experience some swelling and bruising in the area of the incisions.  Ice packs will help.  Swelling and bruising can take several days to resolve.  °6. It is common to experience some constipation if taking pain medication after surgery.  Increasing fluid intake and taking a stool softener (such as Colace) will usually help or prevent this problem from occurring.  A mild laxative (Milk of Magnesia or Miralax) should be taken according to package instructions if there are no bowel movements after 48 hours. °7. Unless discharge instructions indicate otherwise, you may remove your bandages 24-48 hours after surgery, and you may shower at that time.  You may have steri-strips (small skin tapes) in place directly over the incision.  These strips should be left on the skin for 7-10 days.  If your surgeon used skin glue on the incision, you may shower in 24 hours.  The glue will flake off over the next 2-3 weeks.  Any sutures or  staples will be removed at the office during your follow-up visit. °8. ACTIVITIES:  You may resume regular (light) daily activities beginning the next day--such as daily self-care, walking, climbing stairs--gradually increasing activities as tolerated.  You may have sexual intercourse when it is comfortable.  Refrain from any heavy lifting or straining until approved by your doctor. °a. You may drive when you are no longer taking prescription pain medication, you can comfortably wear a seatbelt, and you can safely maneuver your car and apply brakes. °b. RETURN TO WORK:  __________________________________________________________ °9. You should see your doctor in the office for a follow-up appointment approximately 2-3 weeks after your surgery.  Make sure that you call for this appointment within a day or two after you arrive home to insure a convenient appointment time. °10. OTHER INSTRUCTIONS: __________________________________________________________________________________________________________________________ __________________________________________________________________________________________________________________________ °WHEN TO CALL YOUR DOCTOR: °1. Fever over 101.0 °2. Inability to urinate °3. Continued bleeding from incision. °4. Increased pain, redness, or drainage from the incision. °5. Increasing abdominal pain ° °The clinic staff is available to answer your questions during regular business hours.  Please don’t hesitate to call and ask to speak to one of the nurses for clinical concerns.  If you have a medical emergency, go to the nearest emergency room or call 911.  A surgeon from [facility] is always on call at the hospital. °[address], [address], [place], [place]  [address] ? [address] [address], [place], [place]   [address] °[phone] ? [phone] ? FAX [phone] °Web site:   [email or web address] °

## 2018-02-16 NOTE — Anesthesia Preprocedure Evaluation (Signed)
Anesthesia Evaluation  Patient identified by MRN, date of birth, ID band Patient awake    Reviewed: Allergy & Precautions, NPO status , Patient's Chart, lab work & pertinent test results  History of Anesthesia Complications (+) PONV and history of anesthetic complications  Airway Mallampati: II  TM Distance: >3 FB Neck ROM: Full    Dental  (+) Dental Advisory Given   Pulmonary shortness of breath, sleep apnea , pneumonia,    Pulmonary exam normal breath sounds clear to auscultation       Cardiovascular hypertension, Normal cardiovascular exam Rhythm:Regular Rate:Normal     Neuro/Psych PSYCHIATRIC DISORDERS Dementia CVA    GI/Hepatic GERD  ,(+) Hepatitis -  Endo/Other  diabetesHypothyroidism   Renal/GU Renal disease     Musculoskeletal negative musculoskeletal ROS (+)   Abdominal   Peds  Hematology negative hematology ROS (+)   Anesthesia Other Findings   Reproductive/Obstetrics negative OB ROS                             Anesthesia Physical Anesthesia Plan  ASA: III  Anesthesia Plan: General   Post-op Pain Management:    Induction: Intravenous  PONV Risk Score and Plan: 4 or greater and Ondansetron, Dexamethasone, Treatment may vary due to age or medical condition and Midazolam  Airway Management Planned: Oral ETT  Additional Equipment: None  Intra-op Plan:   Post-operative Plan: Extubation in OR  Informed Consent: I have reviewed the patients History and Physical, chart, labs and discussed the procedure including the risks, benefits and alternatives for the proposed anesthesia with the patient or authorized representative who has indicated his/her understanding and acceptance.     Dental advisory given  Plan Discussed with: CRNA  Anesthesia Plan Comments:         Anesthesia Quick Evaluation

## 2018-02-16 NOTE — Interval H&P Note (Signed)
History and Physical Interval Note:  02/16/2018 9:48 AM  Jillian Jimenez  has presented today for surgery, with the diagnosis of biliary dyskinesia  The various methods of treatment have been discussed with the patient and family. After consideration of risks, benefits and other options for treatment, the patient has consented to  Procedure(s): LAPAROSCOPIC CHOLECYSTECTOMY WITH INTRAOPERATIVE CHOLANGIOGRAM (N/A) as a surgical intervention .  The patient's history has been reviewed, patient examined, no change in status, stable for surgery.  I have reviewed the patient's chart and labs.  Questions were answered to the patient's satisfaction.     Darene Lamer Evalee Gerard

## 2018-02-16 NOTE — Anesthesia Procedure Notes (Signed)
Procedure Name: Intubation Date/Time: 02/16/2018 10:16 AM Performed by: Mitzie Na, CRNA Pre-anesthesia Checklist: Patient identified, Emergency Drugs available, Suction available, Timeout performed and Patient being monitored Patient Re-evaluated:Patient Re-evaluated prior to induction Oxygen Delivery Method: Circle system utilized Preoxygenation: Pre-oxygenation with 100% oxygen Induction Type: IV induction Ventilation: Mask ventilation without difficulty and Oral airway inserted - appropriate to patient size Laryngoscope Size: Mac and 3 Grade View: Grade I Tube type: Oral Tube size: 7.0 mm Number of attempts: 1 Airway Equipment and Method: Stylet Placement Confirmation: ETT inserted through vocal cords under direct vision,  positive ETCO2 and breath sounds checked- equal and bilateral Secured at: 22 cm Tube secured with: Tape Dental Injury: Teeth and Oropharynx as per pre-operative assessment

## 2018-02-16 NOTE — Transfer of Care (Signed)
Immediate Anesthesia Transfer of Care Note  Patient: Jillian Jimenez  Procedure(s) Performed: LAPAROSCOPIC CHOLECYSTECTOMY WITH INTRAOPERATIVE CHOLANGIOGRAM (N/A Abdomen)  Patient Location: PACU  Anesthesia Type:General  Level of Consciousness: awake, alert  and oriented  Airway & Oxygen Therapy: Patient Spontanous Breathing and Patient connected to face mask oxygen  Post-op Assessment: Report given to RN and Post -op Vital signs reviewed and stable  Post vital signs: Reviewed and stable  Last Vitals:  Vitals Value Taken Time  BP 158/62 02/16/2018 11:42 AM  Temp    Pulse 96 02/16/2018 11:44 AM  Resp 18 02/16/2018 11:44 AM  SpO2 99 % 02/16/2018 11:44 AM  Vitals shown include unvalidated device data.  Last Pain:  Vitals:   02/16/18 0836  TempSrc:   PainSc: 0-No pain         Complications: No apparent anesthesia complications

## 2018-02-16 NOTE — Op Note (Signed)
Preoperative diagnosis: Cholelithiasis and cholecystitis  Postoperative diagnosis: Cholelithiasis and cholecystitis  Surgical procedure: Laparoscopic cholecystectomy with intraoperative cholangiogram  Surgeon: Marland Kitchen T. Summit Arroyave M.D.  Assistant: None  Anesthesia: General Endotracheal  Complications: None  Estimated blood loss: Minimal  Description of procedure: The patient brought to the operating room, placed in the supine position on the operating table, and general endotracheal anesthesia induced. The abdomen was widely sterilely prepped and draped. The patient had received preoperative IV antibiotics and PAS were in place. Patient timeout was performed the correct procedure verified. Standard 4 port technique was used with an open Hassan cannula at the umbilicus and the remainder of the ports placed under direct vision. The gallbladder was visualized. It appeared grossly normal. The fundus was grasped and elevated up over the liver and the infundibulum retracted inferiolaterally. Peritoneum anterior and posterior to close triangle was incised and fibrofatty tissue stripped off the neck of the gallbladder toward the porta hepatis. The distal gallbladder was thoroughly dissected. The cystic artery was identified in Calot's triangle and the cystic duct gallbladder junction dissected 360.  A good critical view was obtained. When the anatomy was clear the cystic duct was clipped at the gallbladder junction and an operative cholangiogram obtained through the cystic duct. This showed good filling of a normal common bile duct and intrahepatic ducts with free flow into the duodenum and no filling defects. Following this the cholangiocath was removed and the cystic duct was doubly clipped proximally and divided. The cystic artery was doubly clipped proximally and distally and divided. The gallbladder was dissected free from its bed using hook cautery and removed through the umbilical port site.  Complete hemostasis was obtained in the gallbladder bed. The right upper quadrant was thoroughly irrigated and hemostasis assured. Trochars were removed and all CO2 evacuated and the [facility] trocar site fascial defect closed. Skin incisions were closed with subcuticular Monocryl and Dermabond. Sponge needle and instrument counts were correct. The patient was taken to PACU in good condition.  Darene Lamer Cassandra Harbold  02/16/2018

## 2018-02-16 NOTE — Anesthesia Postprocedure Evaluation (Signed)
Anesthesia Post Note  Patient: Jillian Jimenez  Procedure(s) Performed: LAPAROSCOPIC CHOLECYSTECTOMY WITH INTRAOPERATIVE CHOLANGIOGRAM (N/A Abdomen)     Patient location during evaluation: PACU Anesthesia Type: General Level of consciousness: sedated and patient cooperative Pain management: pain level controlled Vital Signs Assessment: post-procedure vital signs reviewed and stable Respiratory status: spontaneous breathing Cardiovascular status: stable Anesthetic complications: no    Last Vitals:  Vitals:   02/16/18 1245 02/16/18 1300  BP: (!) 155/65 (!) 168/78  Pulse: 61 (!) 58  Resp: 11 14  Temp:  36.6 C  SpO2: 100% 100%    Last Pain:  Vitals:   02/16/18 1300  TempSrc:   PainSc: 0-No pain                 Nolon Nations

## 2018-02-17 ENCOUNTER — Encounter (HOSPITAL_COMMUNITY): Payer: Self-pay | Admitting: General Surgery

## 2018-03-03 ENCOUNTER — Ambulatory Visit
Admission: RE | Admit: 2018-03-03 | Discharge: 2018-03-03 | Disposition: A | Discharge status: Home / Self Care | Payer: Medicare Other | Source: Ambulatory Visit | Attending: Obstetrics & Gynecology | Admitting: Obstetrics & Gynecology

## 2018-03-03 DIAGNOSIS — Z1231 Encounter for screening mammogram for malignant neoplasm of breast: Secondary | ICD-10-CM

## 2018-03-04 DIAGNOSIS — G4733 Obstructive sleep apnea (adult) (pediatric): Secondary | ICD-10-CM | POA: Diagnosis not present

## 2018-03-04 DIAGNOSIS — Z0000 Encounter for general adult medical examination without abnormal findings: Secondary | ICD-10-CM | POA: Diagnosis not present

## 2018-03-04 DIAGNOSIS — Z85820 Personal history of malignant melanoma of skin: Secondary | ICD-10-CM | POA: Diagnosis not present

## 2018-03-04 DIAGNOSIS — E7800 Pure hypercholesterolemia, unspecified: Secondary | ICD-10-CM | POA: Diagnosis not present

## 2018-03-04 DIAGNOSIS — Z85828 Personal history of other malignant neoplasm of skin: Secondary | ICD-10-CM | POA: Diagnosis not present

## 2018-03-04 DIAGNOSIS — R1011 Right upper quadrant pain: Secondary | ICD-10-CM | POA: Diagnosis not present

## 2018-03-04 DIAGNOSIS — E039 Hypothyroidism, unspecified: Secondary | ICD-10-CM | POA: Diagnosis not present

## 2018-03-04 DIAGNOSIS — K7581 Nonalcoholic steatohepatitis (NASH): Secondary | ICD-10-CM | POA: Diagnosis not present

## 2018-03-04 DIAGNOSIS — K219 Gastro-esophageal reflux disease without esophagitis: Secondary | ICD-10-CM | POA: Diagnosis not present

## 2018-03-04 DIAGNOSIS — E119 Type 2 diabetes mellitus without complications: Secondary | ICD-10-CM | POA: Diagnosis not present

## 2018-03-04 DIAGNOSIS — E559 Vitamin D deficiency, unspecified: Secondary | ICD-10-CM | POA: Diagnosis not present

## 2018-03-04 DIAGNOSIS — Z1159 Encounter for screening for other viral diseases: Secondary | ICD-10-CM | POA: Diagnosis not present

## 2018-03-26 DIAGNOSIS — E039 Hypothyroidism, unspecified: Secondary | ICD-10-CM | POA: Diagnosis not present

## 2018-03-26 DIAGNOSIS — Z800 Family history of malignant neoplasm of digestive organs: Secondary | ICD-10-CM | POA: Diagnosis not present

## 2018-03-26 DIAGNOSIS — E559 Vitamin D deficiency, unspecified: Secondary | ICD-10-CM | POA: Diagnosis not present

## 2018-03-26 DIAGNOSIS — Z85820 Personal history of malignant melanoma of skin: Secondary | ICD-10-CM | POA: Diagnosis not present

## 2018-03-26 DIAGNOSIS — K219 Gastro-esophageal reflux disease without esophagitis: Secondary | ICD-10-CM | POA: Diagnosis not present

## 2018-03-26 DIAGNOSIS — E119 Type 2 diabetes mellitus without complications: Secondary | ICD-10-CM | POA: Diagnosis not present

## 2018-03-26 DIAGNOSIS — Z9049 Acquired absence of other specified parts of digestive tract: Secondary | ICD-10-CM | POA: Diagnosis not present

## 2018-03-26 DIAGNOSIS — E7800 Pure hypercholesterolemia, unspecified: Secondary | ICD-10-CM | POA: Diagnosis not present

## 2018-06-08 DIAGNOSIS — E559 Vitamin D deficiency, unspecified: Secondary | ICD-10-CM | POA: Diagnosis not present

## 2018-06-08 DIAGNOSIS — E119 Type 2 diabetes mellitus without complications: Secondary | ICD-10-CM | POA: Diagnosis not present

## 2018-06-08 DIAGNOSIS — Z9049 Acquired absence of other specified parts of digestive tract: Secondary | ICD-10-CM | POA: Diagnosis not present

## 2018-06-08 DIAGNOSIS — E7800 Pure hypercholesterolemia, unspecified: Secondary | ICD-10-CM | POA: Diagnosis not present

## 2018-06-09 DIAGNOSIS — G4733 Obstructive sleep apnea (adult) (pediatric): Secondary | ICD-10-CM | POA: Diagnosis not present

## 2018-07-03 DIAGNOSIS — Z85820 Personal history of malignant melanoma of skin: Secondary | ICD-10-CM | POA: Diagnosis not present

## 2018-07-03 DIAGNOSIS — D1801 Hemangioma of skin and subcutaneous tissue: Secondary | ICD-10-CM | POA: Diagnosis not present

## 2018-07-03 DIAGNOSIS — D2271 Melanocytic nevi of right lower limb, including hip: Secondary | ICD-10-CM | POA: Diagnosis not present

## 2018-07-03 DIAGNOSIS — C44311 Basal cell carcinoma of skin of nose: Secondary | ICD-10-CM | POA: Diagnosis not present

## 2018-07-03 DIAGNOSIS — D225 Melanocytic nevi of trunk: Secondary | ICD-10-CM | POA: Diagnosis not present

## 2018-07-03 DIAGNOSIS — D485 Neoplasm of uncertain behavior of skin: Secondary | ICD-10-CM | POA: Diagnosis not present

## 2018-07-03 DIAGNOSIS — L821 Other seborrheic keratosis: Secondary | ICD-10-CM | POA: Diagnosis not present

## 2018-07-10 ENCOUNTER — Other Ambulatory Visit: Payer: Self-pay

## 2018-07-10 ENCOUNTER — Emergency Department (HOSPITAL_COMMUNITY)
Admission: EM | Admit: 2018-07-10 | Discharge: 2018-07-10 | Disposition: A | Discharge status: Home / Self Care | Payer: Medicare Other | Attending: Emergency Medicine | Admitting: Emergency Medicine

## 2018-07-10 ENCOUNTER — Encounter (HOSPITAL_COMMUNITY): Payer: Self-pay | Admitting: Emergency Medicine

## 2018-07-10 DIAGNOSIS — Z79899 Other long term (current) drug therapy: Secondary | ICD-10-CM | POA: Diagnosis not present

## 2018-07-10 DIAGNOSIS — E119 Type 2 diabetes mellitus without complications: Secondary | ICD-10-CM | POA: Insufficient documentation

## 2018-07-10 DIAGNOSIS — Z20828 Contact with and (suspected) exposure to other viral communicable diseases: Secondary | ICD-10-CM | POA: Diagnosis not present

## 2018-07-10 DIAGNOSIS — Z91040 Latex allergy status: Secondary | ICD-10-CM | POA: Insufficient documentation

## 2018-07-10 DIAGNOSIS — E039 Hypothyroidism, unspecified: Secondary | ICD-10-CM | POA: Diagnosis not present

## 2018-07-10 DIAGNOSIS — R110 Nausea: Secondary | ICD-10-CM | POA: Insufficient documentation

## 2018-07-10 DIAGNOSIS — Z8673 Personal history of transient ischemic attack (TIA), and cerebral infarction without residual deficits: Secondary | ICD-10-CM | POA: Diagnosis not present

## 2018-07-10 DIAGNOSIS — R197 Diarrhea, unspecified: Secondary | ICD-10-CM | POA: Diagnosis not present

## 2018-07-10 DIAGNOSIS — R112 Nausea with vomiting, unspecified: Secondary | ICD-10-CM | POA: Diagnosis not present

## 2018-07-10 DIAGNOSIS — I10 Essential (primary) hypertension: Secondary | ICD-10-CM | POA: Diagnosis not present

## 2018-07-10 LAB — CBC
HCT: 40.9 % (ref 36.0–46.0)
Hemoglobin: 12.8 g/dL (ref 12.0–15.0)
MCH: 29.7 pg (ref 26.0–34.0)
MCHC: 31.3 g/dL (ref 30.0–36.0)
MCV: 94.9 fL (ref 80.0–100.0)
Platelets: 188 10*3/uL (ref 150–400)
RBC: 4.31 MIL/uL (ref 3.87–5.11)
RDW: 14.2 % (ref 11.5–15.5)
WBC: 7.2 10*3/uL (ref 4.0–10.5)
nRBC: 0 % (ref 0.0–0.2)

## 2018-07-10 LAB — COMPREHENSIVE METABOLIC PANEL WITH GFR
ALT: 13 U/L (ref 0–44)
AST: 17 U/L (ref 15–41)
Albumin: 4.3 g/dL (ref 3.5–5.0)
Alkaline Phosphatase: 62 U/L (ref 38–126)
Anion gap: 11 (ref 5–15)
BUN: 19 mg/dL (ref 8–23)
CO2: 24 mmol/L (ref 22–32)
Calcium: 9.9 mg/dL (ref 8.9–10.3)
Chloride: 107 mmol/L (ref 98–111)
Creatinine, Ser: 0.9 mg/dL (ref 0.44–1.00)
GFR calc Af Amer: >60 mL/min
GFR calc non Af Amer: >60 mL/min
Glucose, Bld: 120 mg/dL — ABNORMAL HIGH (ref 70–99)
Potassium: 4 mmol/L (ref 3.5–5.1)
Sodium: 142 mmol/L (ref 135–145)
Total Bilirubin: 1 mg/dL (ref 0.3–1.2)
Total Protein: 7.6 g/dL (ref 6.5–8.1)

## 2018-07-10 LAB — LACTIC ACID, PLASMA: Lactic Acid, Venous: 1.5 mmol/L (ref 0.5–1.9)

## 2018-07-10 LAB — URINALYSIS, ROUTINE W REFLEX MICROSCOPIC
Bacteria, UA: NONE SEEN
Bilirubin Urine: NEGATIVE
Glucose, UA: NEGATIVE mg/dL
Hgb urine dipstick: NEGATIVE
Ketones, ur: NEGATIVE mg/dL
Nitrite: NEGATIVE
Protein, ur: NEGATIVE mg/dL
Specific Gravity, Urine: 1.005 (ref 1.005–1.030)
pH: 8 (ref 5.0–8.0)

## 2018-07-10 LAB — TSH: TSH: 2.937 u[IU]/mL (ref 0.350–4.500)

## 2018-07-10 LAB — LIPASE, BLOOD: Lipase: 32 U/L (ref 11–51)

## 2018-07-10 LAB — SARS CORONAVIRUS 2 BY RT PCR (HOSPITAL ORDER, PERFORMED IN ~~LOC~~ HOSPITAL LAB): SARS Coronavirus 2: NEGATIVE

## 2018-07-10 MED ORDER — SODIUM CHLORIDE 0.9 % IV BOLUS
1000.0000 mL | Freq: Once | INTRAVENOUS | Status: AC
  Administered 2018-07-10: 1000 mL via INTRAVENOUS

## 2018-07-10 MED ORDER — ONDANSETRON HCL 4 MG PO TABS: 4.0000 mg | ORAL_TABLET | Freq: Three times a day (TID) | ORAL | 0 refills | Status: DC | PRN

## 2018-07-10 MED ORDER — ONDANSETRON 4 MG PO TBDP: 4.0000 mg | ORAL_TABLET | Freq: Once | ORAL | Status: DC | PRN

## 2018-07-10 MED ORDER — ONDANSETRON HCL 4 MG/2ML IJ SOLN
4.0000 mg | Freq: Once | INTRAMUSCULAR | Status: AC
  Administered 2018-07-10: 4 mg via INTRAVENOUS
  Filled 2018-07-10: qty 2

## 2018-07-10 NOTE — Discharge Instructions (Signed)
Your work-up today was overall reassuring.  We did not see any significant electrolyte abnormality and your coronavirus test was negative.  As you are able to eat and drink and were feeling better, we feel you are safe for discharge home.  Please stay hydrated follow-up with your PCP.  Please use the nausea medicine as needed.  If any symptoms change or worsen, please return to the nurse emergency department.

## 2018-07-10 NOTE — ED Provider Notes (Signed)
[facility] DEPT Provider Note   CSN: [id number] Arrival date & time: 07/10/18  2025     History   Chief Complaint Chief Complaint  Patient presents with  . Nausea    HPI Jillian Jimenez is a 73 y.o. female.     The history is provided by the patient and medical records. No language interpreter was used.  Diarrhea Quality:  Watery Severity:  Severe Onset quality:  Gradual Number of episodes:  Numerous Duration:  3 days Timing:  Constant Progression:  Unchanged Relieved by:  Nothing Worsened by:  Nothing Ineffective treatments:  None tried Associated symptoms: vomiting   Associated symptoms: no abdominal pain (resolved), no chills, no recent cough, no diaphoresis, no fever, no headaches and no URI   Risk factors: no recent antibiotic use     Past Medical History:  Diagnosis Date  . Acid reflux   . Complication of anesthesia    a little hard to wake up  . 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 High cholesterol   . Hypertension   . Hypothyroid   . OSA (obstructive sleep apnea)    on CPAP x 15 years  . Pneumonia   . PONV (postoperative nausea and vomiting)   . Renal cyst   . Skin cancer    melanoma buttocks and right anlke , and basal cell  . 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 Abnormal stress test 10/03/2016  . NASH (nonalcoholic steatohepatitis) 03/28/2016  . Closed left ankle fracture 03/28/2016  . Post concussion syndrome 03/24/2016  . Diabetes mellitus type 2 in obese ([place]) 03/24/2016  . Essential hypertension 03/24/2016  . Hypothyroidism 03/24/2016  . Dyspnea 07/14/2015    Past Surgical History:  Procedure Laterality Date  . ABDOMINAL HYSTERECTOMY    . BUNIONECTOMY     x2  . CESAREAN SECTION     x1  . CHOLECYSTECTOMY N/A 02/16/2018   Procedure: LAPAROSCOPIC CHOLECYSTECTOMY WITH INTRAOPERATIVE CHOLANGIOGRAM;  Surgeon: Excell Seltzer,  MD;  Location: [facility];  Service: General;  Laterality: N/A;  . cyst removed     ovarian, ankle and barthleon gland  . LEFT HEART CATH AND CORONARY ANGIOGRAPHY N/A 10/03/2016   Procedure: LEFT HEART CATH AND CORONARY ANGIOGRAPHY;  Surgeon: Nelva Bush, MD;  Location: [facility];  Service: Cardiovascular;  Laterality: N/A;     OB History   No obstetric history on file.      Home Medications    Prior to Admission medications   Medication Sig Start Date End Date Taking? Authorizing Provider  acetaminophen (TYLENOL) 325 MG tablet Take 2 tablets (650 mg total) by mouth every 6 (six) hours as needed for mild pain (or Fever >/= 101). Patient not taking: Reported on 02/03/2018 03/28/16   Debbe Odea, MD  benazepril (LOTENSIN) 10 MG tablet Take 10 mg by mouth daily.    [provider]  cetirizine (ZYRTEC) 10 MG tablet Take 10 mg by mouth daily.    [provider]  lansoprazole (PREVACID) 15 MG capsule Take 15 mg by mouth daily at 12 noon.     [provider]  levothyroxine (SYNTHROID, LEVOTHROID) 25 MCG tablet Take 25 mcg by mouth daily before breakfast.    [provider]  oxyCODONE (OXY IR/ROXICODONE) 5 MG immediate release tablet Take 1 tablet (5 mg total) by mouth every 6 (six) hours as needed  for moderate pain, severe pain or breakthrough pain. 02/16/18   Excell Seltzer, MD  pioglitazone (ACTOS) 15 MG tablet Take 15 mg by mouth daily.    [provider]    Family History Family History  Problem Relation Age of Onset  . Breast cancer Sister 21  . Breast cancer Maternal Grandmother   . Stomach cancer Maternal Grandmother   . Stomach cancer Mother   . COPD Sister   . Asthma Sister     Social History Social History   Tobacco Use  . Smoking status: Never Smoker  . Smokeless tobacco: Never Used  Substance Use Topics  . Alcohol use: Yes    Alcohol/week: 1.0 standard drinks    Types: 1 Glasses of wine per week    Comment:  every other day  . Drug use: No     Allergies   Aspirin, Augmentin [amoxicillin-pot clavulanate], Clindamycin, Clopidogrel, Promethazine, Cephalexin, Latex, Norvasc [amlodipine], and Sulfa antibiotics   Review of Systems Review of Systems  Constitutional: Positive for fatigue. Negative for chills, diaphoresis and fever.  HENT: Negative for congestion.   Eyes: Negative for visual disturbance.  Respiratory: Negative for cough, chest tightness, shortness of breath, wheezing and stridor.   Cardiovascular: Negative for chest pain and palpitations.  Gastrointestinal: Positive for diarrhea, nausea and vomiting. Negative for abdominal pain (resolved), blood in stool and constipation.  Genitourinary: Negative for enuresis and frequency.  Musculoskeletal: Negative for back pain, neck pain and neck stiffness.  Skin: Negative for rash and wound.  Neurological: Negative for light-headedness and headaches.  Psychiatric/Behavioral: Negative for agitation and confusion.  All other systems reviewed and are negative.    Physical Exam Updated Vital Signs BP (!) 170/69 (BP Location: Left Arm)   Pulse 85   Temp 98.2 F (36.8 C) (Oral)   Resp 18   SpO2 100%   Physical Exam Vitals signs and nursing note reviewed.  Constitutional:      General: She is not in acute distress.    Appearance: She is well-developed. She is not ill-appearing, toxic-appearing or diaphoretic.  HENT:     Head: Normocephalic and atraumatic.     Right Ear: External ear normal.     Left Ear: External ear normal.     Nose: Nose normal. No congestion or rhinorrhea.     Mouth/Throat:     Mouth: Mucous membranes are moist.     Pharynx: No oropharyngeal exudate.  Eyes:     Conjunctiva/sclera: Conjunctivae normal.     Pupils: Pupils are equal, round, and reactive to light.  Neck:     Musculoskeletal: Normal range of motion and neck supple. No muscular tenderness.  Cardiovascular:     Rate and Rhythm: Normal rate.      Pulses: Normal pulses.  Pulmonary:     Effort: No respiratory distress.     Breath sounds: No stridor. No wheezing or rhonchi.  Chest:     Chest wall: No tenderness.  Abdominal:     General: There is no distension.     Tenderness: There is no abdominal tenderness. There is no right CVA tenderness, left CVA tenderness or rebound.  Skin:    General: Skin is warm.     Capillary Refill: Capillary refill takes less than 2 seconds.     Findings: No erythema or rash.  Neurological:     General: No focal deficit present.     Mental Status: She is alert and oriented to person, place, and time.  Motor: No abnormal muscle tone.     Coordination: Coordination normal.     Deep Tendon Reflexes: Reflexes are normal and symmetric.  Psychiatric:        Mood and Affect: Mood normal.      ED Treatments / Results  Labs (all labs ordered are listed, but only abnormal results are displayed) Labs Reviewed  COMPREHENSIVE METABOLIC PANEL - Abnormal; Notable for the following components:      Result Value   Glucose, Bld 120 (*)    All other components within normal limits  URINALYSIS, ROUTINE W REFLEX MICROSCOPIC - Abnormal; Notable for the following components:   Color, Urine STRAW (*)    Leukocytes,Ua TRACE (*)    All other components within normal limits  SARS CORONAVIRUS 2 (HOSPITAL ORDER, [facility])  URINE CULTURE  CBC  LIPASE, BLOOD  LACTIC ACID, PLASMA  TSH  LACTIC ACID, PLASMA    EKG None  Radiology No results found.  Procedures Procedures (including critical care time)  Medications Ordered in ED Medications  sodium chloride 0.9 % bolus 1,000 mL (0 mLs Intravenous Stopped 07/10/18 1220)  ondansetron (ZOFRAN) injection 4 mg (4 mg Intravenous Given 07/10/18 1113)     Initial Impression / Assessment and Plan / ED Course  I have reviewed the triage vital signs and the nursing notes.  Pertinent labs & imaging results that were available during my  care of the patient were reviewed by me and considered in my medical decision making (see chart for details).        Timarie Labell is a 73 y.o. female with a past medical history significant for diabetes, hypertension, hypothyroidism, hypercholesterolemia, stroke, and prior cholecystectomy who presents with nausea, vomiting, diarrhea, malaise, fatigue, and chills.  Patient reports that she has been feeling bad for the last 3 days.  She reports that she has had nausea and dry heaving as well as a little bit of vomiting.  She reports has had intermittent diarrhea with watery stool without any bleeding.  She reports no urinary symptoms.  She denies trauma.  She reports that she had some abdominal pain when it began but has not had pain currently.  She reports has had some hot flashes and chills but denies any fevers at home.  She report feeling very fatigued and tired.  PCP told her to come to the emergency room for evaluation and rehydration.  Patient denies any pain headache, neck pain, neck stiffness, or other complaints.  On exam, lungs clear chest is nontender.  Abdomen is nontender on exam.  Flanks nontender, CVA area is nontender.  Patient moving all extremities.  Patient is dry heaving on my exam.  Clinically I suspect patient may have a viral gastroenteritis with nausea vomiting, diarrhea.  She is still passing gas and stool have low suspicion for obstruction in her GI tract.  We had a sure decision conversation and agreed to hold on CT imaging or other imaging initially but will check labs, rehydrate, give her Zofran for her nausea, and reassess.  Due to the subjective chills, hot flashes, and her GI symptoms and the current coronavirus pandemic, will check for coronavirus.  Anticipate reassessment of her work-up.  3:08 PM Patient feeling much better after nausea medicine and fluids.  Patient's laboratory testing shows no significant abnormality and she was able to eat and drink after p.o.  challenge.  Suspect viral gastroenteritis however coronavirus test was negative.  As patient is feeling better, she will  be discharged home with prescription for Zofran and follow-up instructions.  Patient otherwise no concerns and was discharged in good condition.     Final Clinical Impressions(s) / ED Diagnoses   Final diagnoses:  Nausea without vomiting  Diarrhea, unspecified type    ED Discharge Orders         Ordered    ondansetron (ZOFRAN) 4 MG tablet  Every 8 hours PRN     07/10/18 1510          Clinical Impression: 1. Nausea without vomiting   2. Diarrhea, unspecified type     Disposition: Discharge  Condition: Good  I have discussed the results, Dx and Tx plan with the pt(& family if present). He/she/they expressed understanding and agree(s) with the plan. Discharge instructions discussed at great length. Strict return precautions discussed and pt &/or family have verbalized understanding of the instructions. No further questions at time of discharge.    New Prescriptions   ONDANSETRON (ZOFRAN) 4 MG TABLET    Take 1 tablet (4 mg total) by mouth every 8 (eight) hours as needed for nausea or vomiting.    Follow Up: Leighton Ruff, [place] [address] [facility] [address] [id number] [place]           , Gwenyth Allegra, MD 07/10/18 [phone]

## 2018-07-10 NOTE — ED Triage Notes (Signed)
Per pt, states nausea that started yesterday, body aches and weakness the day before-states she feels like she has been hit by a truck-told by PCP to come to ED for eval-

## 2018-07-11 LAB — URINE CULTURE

## 2018-07-13 ENCOUNTER — Other Ambulatory Visit: Payer: Self-pay

## 2018-07-13 ENCOUNTER — Emergency Department (HOSPITAL_COMMUNITY)
Admission: EM | Admit: 2018-07-13 | Discharge: 2018-07-13 | Disposition: A | Discharge status: Home / Self Care | Payer: Medicare Other | Attending: Emergency Medicine | Admitting: Emergency Medicine

## 2018-07-13 ENCOUNTER — Emergency Department (HOSPITAL_COMMUNITY): Payer: Medicare Other

## 2018-07-13 ENCOUNTER — Encounter (HOSPITAL_COMMUNITY): Payer: Self-pay

## 2018-07-13 DIAGNOSIS — Z209 Contact with and (suspected) exposure to unspecified communicable disease: Secondary | ICD-10-CM | POA: Diagnosis not present

## 2018-07-13 DIAGNOSIS — E039 Hypothyroidism, unspecified: Secondary | ICD-10-CM | POA: Diagnosis not present

## 2018-07-13 DIAGNOSIS — I10 Essential (primary) hypertension: Secondary | ICD-10-CM | POA: Insufficient documentation

## 2018-07-13 DIAGNOSIS — Z91040 Latex allergy status: Secondary | ICD-10-CM | POA: Insufficient documentation

## 2018-07-13 DIAGNOSIS — R0781 Pleurodynia: Secondary | ICD-10-CM | POA: Diagnosis not present

## 2018-07-13 DIAGNOSIS — E119 Type 2 diabetes mellitus without complications: Secondary | ICD-10-CM | POA: Diagnosis not present

## 2018-07-13 DIAGNOSIS — R110 Nausea: Secondary | ICD-10-CM | POA: Diagnosis not present

## 2018-07-13 DIAGNOSIS — R0602 Shortness of breath: Secondary | ICD-10-CM

## 2018-07-13 DIAGNOSIS — Z79899 Other long term (current) drug therapy: Secondary | ICD-10-CM | POA: Insufficient documentation

## 2018-07-13 DIAGNOSIS — R0902 Hypoxemia: Secondary | ICD-10-CM | POA: Diagnosis not present

## 2018-07-13 DIAGNOSIS — R0789 Other chest pain: Secondary | ICD-10-CM | POA: Diagnosis not present

## 2018-07-13 DIAGNOSIS — I491 Atrial premature depolarization: Secondary | ICD-10-CM | POA: Diagnosis not present

## 2018-07-13 DIAGNOSIS — Z8673 Personal history of transient ischemic attack (TIA), and cerebral infarction without residual deficits: Secondary | ICD-10-CM | POA: Insufficient documentation

## 2018-07-13 DIAGNOSIS — Z85828 Personal history of other malignant neoplasm of skin: Secondary | ICD-10-CM | POA: Diagnosis not present

## 2018-07-13 DIAGNOSIS — Z20828 Contact with and (suspected) exposure to other viral communicable diseases: Secondary | ICD-10-CM | POA: Insufficient documentation

## 2018-07-13 DIAGNOSIS — R079 Chest pain, unspecified: Secondary | ICD-10-CM | POA: Diagnosis not present

## 2018-07-13 LAB — CBC
HCT: 40.2 % (ref 36.0–46.0)
Hemoglobin: 12.7 g/dL (ref 12.0–15.0)
MCH: 29.5 pg (ref 26.0–34.0)
MCHC: 31.6 g/dL (ref 30.0–36.0)
MCV: 93.5 fL (ref 80.0–100.0)
Platelets: 160 10*3/uL (ref 150–400)
RBC: 4.3 MIL/uL (ref 3.87–5.11)
RDW: 14 % (ref 11.5–15.5)
WBC: 5.8 10*3/uL (ref 4.0–10.5)
nRBC: 0 % (ref 0.0–0.2)

## 2018-07-13 LAB — BASIC METABOLIC PANEL
Anion gap: 13 (ref 5–15)
BUN: 11 mg/dL (ref 8–23)
CO2: 24 mmol/L (ref 22–32)
Calcium: 9.5 mg/dL (ref 8.9–10.3)
Chloride: 107 mmol/L (ref 98–111)
Creatinine, Ser: 0.86 mg/dL (ref 0.44–1.00)
GFR calc Af Amer: >60 mL/min (ref >60)
GFR calc non Af Amer: >60 mL/min (ref >60)
Glucose, Bld: 114 mg/dL — ABNORMAL HIGH (ref 70–99)
Potassium: 3.9 mmol/L (ref 3.5–5.1)
Sodium: 144 mmol/L (ref 135–145)

## 2018-07-13 LAB — TROPONIN I (HIGH SENSITIVITY)
Troponin I (High Sensitivity): <2 ng/L (ref <18)
Troponin I (High Sensitivity): <2 ng/L (ref <18)

## 2018-07-13 LAB — BRAIN NATRIURETIC PEPTIDE: B Natriuretic Peptide: 51 pg/mL (ref 0.0–100.0)

## 2018-07-13 LAB — D-DIMER, QUANTITATIVE: D-Dimer, Quant: 0.72 ug/mL-FEU — ABNORMAL HIGH (ref 0.00–0.50)

## 2018-07-13 MED ORDER — IOHEXOL 300 MG/ML  SOLN
100.0000 mL | Freq: Once | INTRAMUSCULAR | Status: AC | PRN
  Administered 2018-07-13: 100 mL via INTRAVENOUS

## 2018-07-13 MED ORDER — ASPIRIN 81 MG PO CHEW: 324.0000 mg | CHEWABLE_TABLET | Freq: Once | ORAL | Status: DC

## 2018-07-13 MED ORDER — SODIUM CHLORIDE 0.9% FLUSH: 3.0000 mL | Freq: Once | INTRAVENOUS | Status: DC

## 2018-07-13 MED ORDER — ONDANSETRON HCL 4 MG/2ML IJ SOLN
4.0000 mg | Freq: Once | INTRAMUSCULAR | Status: AC
  Administered 2018-07-13: 4 mg via INTRAVENOUS
  Filled 2018-07-13: qty 2

## 2018-07-13 NOTE — ED Triage Notes (Signed)
Pt bib ems due to chest pain and sob. Pt states that she uses cpap at night for sleep apnea and today woke up with a burning centralized chest pain with Sob. Pt was given 324 asa en route no chest pain at this time. Pt still feeling sob. Pt was swab for the coronavirus at [facility] that came back neg.

## 2018-07-13 NOTE — Discharge Instructions (Signed)
If you develop recurrent, continued, or worsening chest pain, shortness of breath, fever, vomiting, abdominal or back pain, or any other new/concerning symptoms then return to the ER for evaluation.  

## 2018-07-13 NOTE — ED Notes (Signed)
Pt ambulated to restroom and then will got to CT

## 2018-07-13 NOTE — ED Provider Notes (Signed)
[facility] EMERGENCY DEPARTMENT Provider Note   CSN: [id number] Arrival date & time: 07/13/18  0701    History   Chief Complaint Chief Complaint  Patient presents with   Chest Pain   Shortness of Breath    HPI Jillian Jimenez is a 73 y.o. female.  HPI 73 year old female presents with shortness of breath and chest pain.  Started around 5:30 AM.  She woke up many times on her CPAP last night.  She has been feeling short of breath since she woke up and has also been having some pleuritic chest pain with deep breaths.  The chest pain part seems to be better but she still feels like she cannot catch her breath.  She previously was ill with nausea and vomiting but since being discharged with Zofran that has resolved over the last day or so.  Cannot really describe what the chest pain felt like except that it hurt with breaths.  No cough.  No lower extremity swelling.  Past Medical History:  Diagnosis Date   Acid reflux    Complication of anesthesia    a little hard to wake up   Diabetes mellitus ([place])    pt. 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    Patient Active Problem List   Diagnosis Date Noted   Abnormal stress test 10/03/2016   NASH (nonalcoholic steatohepatitis) 03/28/2016   Closed left ankle fracture 03/28/2016   Post concussion syndrome 03/24/2016   Diabetes mellitus type 2 in obese ([place]) 03/24/2016   Essential hypertension 03/24/2016   Hypothyroidism 03/24/2016   Dyspnea 07/14/2015    Past Surgical History:  Procedure Laterality Date   ABDOMINAL HYSTERECTOMY     BUNIONECTOMY     x2   CESAREAN SECTION     x1   CHOLECYSTECTOMY N/A 02/16/2018   Procedure: LAPAROSCOPIC CHOLECYSTECTOMY WITH INTRAOPERATIVE CHOLANGIOGRAM;  Surgeon: Excell Seltzer, MD;  Location: [facility];  Service: General;  Laterality: N/A;   cyst removed     ovarian, ankle and barthleon gland   LEFT HEART CATH AND CORONARY ANGIOGRAPHY N/A 10/03/2016   Procedure: LEFT HEART CATH AND CORONARY ANGIOGRAPHY;  Surgeon: Nelva Bush, MD;  Location: [facility];  Service: Cardiovascular;  Laterality: N/A;     OB History   No obstetric history on file.      Home Medications    Prior to Admission medications   Medication Sig Start Date End Date Taking? Authorizing Provider  benazepril (LOTENSIN) 10 MG tablet Take 10 mg by mouth daily.   Yes [provider]  cetirizine (ZYRTEC) 10 MG tablet Take 10 mg by mouth daily.   Yes [provider]  lansoprazole (PREVACID) 15 MG capsule Take 15 mg by mouth at bedtime.    Yes [provider]  levothyroxine (SYNTHROID, LEVOTHROID) 25 MCG tablet Take 25 mcg by mouth daily before breakfast.   Yes [provider]  ondansetron (ZOFRAN) 4 MG tablet Take 1 tablet (4 mg total) by mouth every 8 (eight) hours as needed for nausea or vomiting. 07/10/18  Yes Tegeler, Gwenyth Allegra, MD  pioglitazone (ACTOS) 15  MG tablet Take 15 mg by mouth daily.   Yes [provider]  Vitamin D, Ergocalciferol, (DRISDOL) 1.25 MG (50000 UT) CAPS capsule Take 50,000 Units by mouth once a week. On Wednesdays 06/11/18  Yes [provider]  vitamin k 100 MCG tablet Take 100 mcg by mouth once a week. On wednesdays   Yes [provider]  acetaminophen (TYLENOL) 325 MG tablet Take 2 tablets (650 mg total) by mouth every 6 (six) hours as needed for mild pain (or Fever >/= 101). Patient not taking: Reported on 02/03/2018 03/28/16   Debbe Odea, MD  oxyCODONE (OXY IR/ROXICODONE) 5 MG immediate release tablet Take 1 tablet (5 mg total) by mouth every 6 (six) hours as needed for moderate pain, severe pain  or breakthrough pain. Patient not taking: Reported on 07/10/2018 02/16/18   Excell Seltzer, MD    Family History Family History  Problem Relation Age of Onset   Breast cancer Sister 67   Breast cancer Maternal Grandmother    Stomach cancer Maternal Grandmother    Stomach cancer Mother    COPD Sister    Asthma Sister     Social History Social History   Tobacco Use   Smoking status: Never Smoker   Smokeless tobacco: Never Used  Substance Use Topics   Alcohol use: Yes    Alcohol/week: 1.0 standard drinks    Types: 1 Glasses of wine per week    Comment: every other day   Drug use: No     Allergies   Aspirin, Augmentin [amoxicillin-pot clavulanate], Clindamycin, Clopidogrel, Promethazine, Cephalexin, Latex, Norvasc [amlodipine], and Sulfa antibiotics   Review of Systems Review of Systems  Constitutional: Negative for fever.  Respiratory: Positive for shortness of breath. Negative for cough.   Cardiovascular: Positive for chest pain. Negative for leg swelling.  Gastrointestinal: Negative for abdominal pain.  All other systems reviewed and are negative.    Physical Exam Updated Vital Signs BP (!) 147/64 (BP Location: Left Arm)    Pulse 79    Temp 98.5 F (36.9 C) (Oral)    Resp 20    Ht 4\' 11"  (1.499 m)    Wt 81.6 kg    SpO2 99%    BMI 36.36 kg/m   Physical Exam Vitals signs and nursing note reviewed.  Constitutional:      General: She is not in acute distress.    Appearance: She is well-developed. She is not ill-appearing.  HENT:     Head: Normocephalic and atraumatic.     Right Ear: External ear normal.     Left Ear: External ear normal.     Nose: Nose normal.  Eyes:     General:        Right eye: No discharge.        Left eye: No discharge.  Cardiovascular:     Rate and Rhythm: Normal rate and regular rhythm.     Heart sounds: Normal heart sounds.  Pulmonary:     Effort: Pulmonary effort is normal. No tachypnea, accessory muscle usage or  respiratory distress.     Breath sounds: Normal breath sounds.  Abdominal:     Palpations: Abdomen is soft.     Tenderness: There is no abdominal tenderness.  Musculoskeletal:     Right lower leg: No edema.     Left lower leg: No edema.  Skin:    General: Skin is warm and dry.  Neurological:     Mental Status: She is alert.  Psychiatric:  Mood and Affect: Mood is not anxious.      ED Treatments / Results  Labs (all labs ordered are listed, but only abnormal results are displayed) Labs Reviewed  BASIC METABOLIC PANEL - Abnormal; Notable for the following components:      Result Value   Glucose, Bld 114 (*)    All other components within normal limits  D-DIMER, QUANTITATIVE (NOT AT [facility]) - Abnormal; Notable for the following components:   D-Dimer, Quant 0.72 (*)    All other components within normal limits  NOVEL CORONAVIRUS, NAA (HOSPITAL ORDER, SEND-OUT TO REF LAB)  CBC  TROPONIN I (HIGH SENSITIVITY)  TROPONIN I (HIGH SENSITIVITY)  BRAIN NATRIURETIC PEPTIDE    EKG EKG Interpretation  Date/Time:  Monday July 13 2018 07:09:13 EDT Ventricular Rate:  75 PR Interval:    QRS Duration: 99 QT Interval:  385 QTC Calculation: 430 R Axis:   65 Text Interpretation:  Sinus rhythm Abnormal R-wave progression, early transition Borderline T abnormalities, anterior leads Confirmed by Sherwood Gambler [phone]) on 07/13/2018 7:12:04 AM Also confirmed by Sherwood Gambler [phone]), editor Philomena Doheny [phone])  on 07/13/2018 7:53:35 AM   Radiology Dg Chest 2 View  Result Date: 07/13/2018 CLINICAL DATA:  Chest pain and shortness of breath. EXAM: CHEST - 2 VIEW COMPARISON:  03/24/2016 FINDINGS: The heart size and mediastinal contours are within normal limits considering the AP technique. Both lungs are clear. The visualized skeletal structures are unremarkable. IMPRESSION: Normal exam. Electronically Signed   By: Lorriane Shire M.D.   On: 07/13/2018 07:38   Ct Angio Chest Pe W And/or Wo  Contrast  Result Date: 07/13/2018 CLINICAL DATA:  Centralized chest pain with shortness of breath today. EXAM: CT ANGIOGRAPHY CHEST WITH CONTRAST TECHNIQUE: Multidetector CT imaging of the chest was performed using the standard protocol during bolus administration of intravenous contrast. Multiplanar CT image reconstructions and MIPs were obtained to evaluate the vascular anatomy. CONTRAST:  148mL OMNIPAQUE IOHEXOL 300 MG/ML  SOLN COMPARISON:  None. FINDINGS: Cardiovascular: Some of the most peripheral segmental and subsegmental pulmonary artery branches cannot be definitively characterized due to mild patient breathing motion artifact, however, there is no pulmonary embolism identified within the main, lobar or central segmental pulmonary arteries bilaterally. No secondary signs of a peripheral pulmonary embolus. No thoracic aortic aneurysm or evidence of aortic dissection. Mild aortic atherosclerosis. Size is upper normal. No significant pericardial effusion. Mediastinum/Nodes: No mass or enlarged lymph nodes seen within the mediastinum or perihilar regions. Esophagus is unremarkable. Trachea and central bronchi are unremarkable. Lungs/Pleura: Lungs are clear. No pleural effusion or pneumothorax. Upper Abdomen: Limited images of the upper abdomen are unremarkable. Musculoskeletal: Mild degenerative spondylosis of the slightly kyphotic thoracic spine. No acute or suspicious osseous finding. Review of the MIP images confirms the above findings. IMPRESSION: No acute findings. No pulmonary embolism seen, with mild study limitations detailed above. No aortic aneurysm or evidence of aortic dissection. Lungs are clear. Aortic Atherosclerosis (ICD10-I70.0). Electronically Signed   By: Franki Cabot M.D.   On: 07/13/2018 09:47    Procedures Procedures (including critical care time)  Medications Ordered in ED Medications  sodium chloride flush (NS) 0.9 % injection 3 mL (3 mLs Intravenous Not Given 07/13/18 0752)    aspirin chewable tablet 324 mg (324 mg Oral Not Given 07/13/18 0752)  iohexol (OMNIPAQUE) 300 MG/ML solution 100 mL (100 mLs Intravenous Contrast Given 07/13/18 0923)  ondansetron (ZOFRAN) injection 4 mg (4 mg Intravenous Given 07/13/18 1211)     Initial Impression /  Assessment and Plan / ED Course  I have reviewed the triage vital signs and the nursing notes.  Pertinent labs & imaging results that were available during my care of the patient were reviewed by me and considered in my medical decision making (see chart for details).     HEAR Score: 5  Patient symptoms have gradually improved while in the emergency department.  CT angiography does not show PE.  Troponins are negative x2.  ECG does not show any obvious or concerning findings that would indicate new ischemia.  I had originally discussed with patient and given her moderate heart score despite her troponin is negative x2 she wanted to be observed.  Discussing with Dr. Tamala Julian, she had a heart cath in 2018 with no significant coronary disease seen.  I discussed this again with patient and she is okay with going home with close follow-up with her PCP.  I think this is pretty reasonable as I think ACS would be pretty unlikely in this scenario.  We discussed return precautions.  While she had a negative novel coronavirus test a few days ago, she is asking for another as she was recently found to be in contact with someone who was positive.  I will send the outpatient swab.  Jillian Jimenez was evaluated in Emergency Department on 07/13/2018 for the symptoms described in the history of present illness. She was evaluated in the context of the global COVID-19 pandemic, which necessitated consideration that the patient might be at risk for infection with the SARS-CoV-2 virus that causes COVID-19. Institutional protocols and algorithms that pertain to the evaluation of patients at risk for COVID-19 are in a state of rapid change based on information  released by regulatory bodies including the CDC and federal and state organizations. These policies and algorithms were followed during the patient's care in the ED.   Final Clinical Impressions(s) / ED Diagnoses   Final diagnoses:  Shortness of breath    ED Discharge Orders    None       Sherwood Gambler, MD 07/13/18 1237

## 2018-07-15 LAB — NOVEL CORONAVIRUS, NAA (HOSP ORDER, SEND-OUT TO REF LAB; TAT 18-24 HRS): SARS-CoV-2, NAA: NOT DETECTED

## 2018-07-16 DIAGNOSIS — S91301D Unspecified open wound, right foot, subsequent encounter: Secondary | ICD-10-CM | POA: Diagnosis not present

## 2018-07-17 DIAGNOSIS — K529 Noninfective gastroenteritis and colitis, unspecified: Secondary | ICD-10-CM | POA: Diagnosis not present

## 2018-07-17 DIAGNOSIS — R1011 Right upper quadrant pain: Secondary | ICD-10-CM | POA: Diagnosis not present

## 2018-07-17 DIAGNOSIS — Z9049 Acquired absence of other specified parts of digestive tract: Secondary | ICD-10-CM | POA: Diagnosis not present

## 2018-09-03 DIAGNOSIS — C44311 Basal cell carcinoma of skin of nose: Secondary | ICD-10-CM | POA: Diagnosis not present

## 2018-09-15 DIAGNOSIS — L659 Nonscarring hair loss, unspecified: Secondary | ICD-10-CM | POA: Diagnosis not present

## 2018-09-15 DIAGNOSIS — G4733 Obstructive sleep apnea (adult) (pediatric): Secondary | ICD-10-CM | POA: Diagnosis not present

## 2018-09-15 DIAGNOSIS — G4700 Insomnia, unspecified: Secondary | ICD-10-CM | POA: Diagnosis not present

## 2018-09-18 DIAGNOSIS — E559 Vitamin D deficiency, unspecified: Secondary | ICD-10-CM | POA: Diagnosis not present

## 2018-09-18 DIAGNOSIS — L659 Nonscarring hair loss, unspecified: Secondary | ICD-10-CM | POA: Diagnosis not present

## 2018-09-18 DIAGNOSIS — E7800 Pure hypercholesterolemia, unspecified: Secondary | ICD-10-CM | POA: Diagnosis not present

## 2018-09-22 DIAGNOSIS — G4733 Obstructive sleep apnea (adult) (pediatric): Secondary | ICD-10-CM | POA: Diagnosis not present

## 2018-09-22 DIAGNOSIS — K219 Gastro-esophageal reflux disease without esophagitis: Secondary | ICD-10-CM | POA: Diagnosis not present

## 2018-09-22 DIAGNOSIS — E119 Type 2 diabetes mellitus without complications: Secondary | ICD-10-CM | POA: Diagnosis not present

## 2018-09-22 DIAGNOSIS — E559 Vitamin D deficiency, unspecified: Secondary | ICD-10-CM | POA: Diagnosis not present

## 2018-09-22 DIAGNOSIS — E039 Hypothyroidism, unspecified: Secondary | ICD-10-CM | POA: Diagnosis not present

## 2018-09-22 DIAGNOSIS — K7581 Nonalcoholic steatohepatitis (NASH): Secondary | ICD-10-CM | POA: Diagnosis not present

## 2018-09-22 DIAGNOSIS — E7800 Pure hypercholesterolemia, unspecified: Secondary | ICD-10-CM | POA: Diagnosis not present

## 2018-09-22 DIAGNOSIS — Z8679 Personal history of other diseases of the circulatory system: Secondary | ICD-10-CM | POA: Diagnosis not present

## 2018-10-19 DIAGNOSIS — J069 Acute upper respiratory infection, unspecified: Secondary | ICD-10-CM | POA: Diagnosis not present

## 2018-10-19 DIAGNOSIS — J9801 Acute bronchospasm: Secondary | ICD-10-CM | POA: Diagnosis not present

## 2018-10-20 DIAGNOSIS — J069 Acute upper respiratory infection, unspecified: Secondary | ICD-10-CM | POA: Diagnosis not present

## 2019-01-05 ENCOUNTER — Ambulatory Visit: Payer: Medicare Other | Attending: Internal Medicine

## 2019-01-05 DIAGNOSIS — Z20822 Contact with and (suspected) exposure to covid-19: Secondary | ICD-10-CM

## 2019-01-12 ENCOUNTER — Telehealth: Payer: Self-pay | Admitting: *Deleted

## 2019-01-12 ENCOUNTER — Ambulatory Visit: Payer: Self-pay | Admitting: *Deleted

## 2019-01-12 NOTE — Telephone Encounter (Signed)
Returned call to patient to discuss not getting a result to her covid test. There are no results in the chart. Pt advised to check the [facility] web site to see if her results are there. She took the test on 01/05/2019.  Advised her to check with her pcp in regards to getting check to see if she has the antibodies from the covid. She voiced understanding.

## 2019-01-12 NOTE — Telephone Encounter (Signed)
See telephone encounter.

## 2019-01-13 DIAGNOSIS — M1180 Other specified crystal arthropathies, unspecified site: Secondary | ICD-10-CM | POA: Diagnosis not present

## 2019-01-13 DIAGNOSIS — M25562 Pain in left knee: Secondary | ICD-10-CM | POA: Diagnosis not present

## 2019-01-19 DIAGNOSIS — Z711 Person with feared health complaint in whom no diagnosis is made: Secondary | ICD-10-CM | POA: Diagnosis not present

## 2019-01-20 DIAGNOSIS — M25562 Pain in left knee: Secondary | ICD-10-CM | POA: Diagnosis not present

## 2019-01-26 DIAGNOSIS — M222X2 Patellofemoral disorders, left knee: Secondary | ICD-10-CM | POA: Diagnosis not present

## 2019-01-26 DIAGNOSIS — M25562 Pain in left knee: Secondary | ICD-10-CM | POA: Diagnosis not present

## 2019-02-23 DIAGNOSIS — M25562 Pain in left knee: Secondary | ICD-10-CM | POA: Diagnosis not present

## 2019-03-04 DIAGNOSIS — J3489 Other specified disorders of nose and nasal sinuses: Secondary | ICD-10-CM | POA: Diagnosis not present

## 2019-03-04 DIAGNOSIS — R05 Cough: Secondary | ICD-10-CM | POA: Diagnosis not present

## 2019-03-04 DIAGNOSIS — R0981 Nasal congestion: Secondary | ICD-10-CM | POA: Diagnosis not present

## 2019-04-05 DIAGNOSIS — K7581 Nonalcoholic steatohepatitis (NASH): Secondary | ICD-10-CM | POA: Diagnosis not present

## 2019-04-05 DIAGNOSIS — R7303 Prediabetes: Secondary | ICD-10-CM | POA: Diagnosis not present

## 2019-04-05 DIAGNOSIS — I10 Essential (primary) hypertension: Secondary | ICD-10-CM | POA: Diagnosis not present

## 2019-04-05 DIAGNOSIS — G4733 Obstructive sleep apnea (adult) (pediatric): Secondary | ICD-10-CM | POA: Diagnosis not present

## 2019-04-05 DIAGNOSIS — E559 Vitamin D deficiency, unspecified: Secondary | ICD-10-CM | POA: Diagnosis not present

## 2019-04-05 DIAGNOSIS — K219 Gastro-esophageal reflux disease without esophagitis: Secondary | ICD-10-CM | POA: Diagnosis not present

## 2019-04-05 DIAGNOSIS — R1011 Right upper quadrant pain: Secondary | ICD-10-CM | POA: Diagnosis not present

## 2019-05-25 DIAGNOSIS — E538 Deficiency of other specified B group vitamins: Secondary | ICD-10-CM | POA: Diagnosis not present

## 2019-05-25 DIAGNOSIS — R000 Tachycardia, unspecified: Secondary | ICD-10-CM | POA: Diagnosis not present

## 2019-05-25 DIAGNOSIS — K219 Gastro-esophageal reflux disease without esophagitis: Secondary | ICD-10-CM | POA: Diagnosis not present

## 2019-05-25 DIAGNOSIS — E7800 Pure hypercholesterolemia, unspecified: Secondary | ICD-10-CM | POA: Diagnosis not present

## 2019-05-25 DIAGNOSIS — K7581 Nonalcoholic steatohepatitis (NASH): Secondary | ICD-10-CM | POA: Diagnosis not present

## 2019-05-25 DIAGNOSIS — R1011 Right upper quadrant pain: Secondary | ICD-10-CM | POA: Diagnosis not present

## 2019-05-25 DIAGNOSIS — G4733 Obstructive sleep apnea (adult) (pediatric): Secondary | ICD-10-CM | POA: Diagnosis not present

## 2019-05-25 DIAGNOSIS — E039 Hypothyroidism, unspecified: Secondary | ICD-10-CM | POA: Diagnosis not present

## 2019-05-25 DIAGNOSIS — H8109 Meniere's disease, unspecified ear: Secondary | ICD-10-CM | POA: Diagnosis not present

## 2019-05-25 DIAGNOSIS — D649 Anemia, unspecified: Secondary | ICD-10-CM | POA: Diagnosis not present

## 2019-05-25 DIAGNOSIS — E119 Type 2 diabetes mellitus without complications: Secondary | ICD-10-CM | POA: Diagnosis not present

## 2019-05-25 DIAGNOSIS — E559 Vitamin D deficiency, unspecified: Secondary | ICD-10-CM | POA: Diagnosis not present

## 2019-05-25 DIAGNOSIS — Z0000 Encounter for general adult medical examination without abnormal findings: Secondary | ICD-10-CM | POA: Diagnosis not present

## 2019-05-26 ENCOUNTER — Other Ambulatory Visit: Payer: Self-pay | Admitting: Family Medicine

## 2019-05-26 DIAGNOSIS — R1011 Right upper quadrant pain: Secondary | ICD-10-CM

## 2019-05-27 ENCOUNTER — Encounter: Payer: Self-pay | Admitting: Gastroenterology

## 2019-05-28 ENCOUNTER — Other Ambulatory Visit: Payer: Self-pay | Admitting: Obstetrics & Gynecology

## 2019-05-28 DIAGNOSIS — Z1231 Encounter for screening mammogram for malignant neoplasm of breast: Secondary | ICD-10-CM

## 2019-05-31 ENCOUNTER — Other Ambulatory Visit: Payer: Self-pay

## 2019-05-31 ENCOUNTER — Ambulatory Visit
Admission: RE | Admit: 2019-05-31 | Discharge: 2019-05-31 | Disposition: A | Discharge status: Home / Self Care | Payer: Medicare Other | Source: Ambulatory Visit

## 2019-05-31 DIAGNOSIS — Z1231 Encounter for screening mammogram for malignant neoplasm of breast: Secondary | ICD-10-CM

## 2019-06-02 ENCOUNTER — Ambulatory Visit
Admission: RE | Admit: 2019-06-02 | Discharge: 2019-06-02 | Disposition: A | Discharge status: Home / Self Care | Payer: Medicare Other | Source: Ambulatory Visit | Attending: Family Medicine | Admitting: Family Medicine

## 2019-06-02 DIAGNOSIS — K760 Fatty (change of) liver, not elsewhere classified: Secondary | ICD-10-CM | POA: Diagnosis not present

## 2019-06-02 DIAGNOSIS — R1011 Right upper quadrant pain: Secondary | ICD-10-CM

## 2019-06-03 DIAGNOSIS — E569 Vitamin deficiency, unspecified: Secondary | ICD-10-CM | POA: Diagnosis not present

## 2019-07-05 ENCOUNTER — Ambulatory Visit: Payer: Medicare Other | Admitting: Gastroenterology

## 2019-08-31 DIAGNOSIS — E559 Vitamin D deficiency, unspecified: Secondary | ICD-10-CM | POA: Diagnosis not present

## 2019-08-31 DIAGNOSIS — E538 Deficiency of other specified B group vitamins: Secondary | ICD-10-CM | POA: Diagnosis not present

## 2019-11-18 DIAGNOSIS — E559 Vitamin D deficiency, unspecified: Secondary | ICD-10-CM | POA: Diagnosis not present

## 2019-11-18 DIAGNOSIS — E039 Hypothyroidism, unspecified: Secondary | ICD-10-CM | POA: Diagnosis not present

## 2019-11-18 DIAGNOSIS — K7581 Nonalcoholic steatohepatitis (NASH): Secondary | ICD-10-CM | POA: Diagnosis not present

## 2019-11-29 DIAGNOSIS — S060X0A Concussion without loss of consciousness, initial encounter: Secondary | ICD-10-CM | POA: Diagnosis not present

## 2019-11-29 DIAGNOSIS — S0990XA Unspecified injury of head, initial encounter: Secondary | ICD-10-CM | POA: Diagnosis not present

## 2019-11-29 DIAGNOSIS — R42 Dizziness and giddiness: Secondary | ICD-10-CM | POA: Diagnosis not present

## 2019-11-30 DIAGNOSIS — S060X0D Concussion without loss of consciousness, subsequent encounter: Secondary | ICD-10-CM | POA: Diagnosis not present

## 2019-12-31 DIAGNOSIS — B349 Viral infection, unspecified: Secondary | ICD-10-CM | POA: Diagnosis not present

## 2019-12-31 DIAGNOSIS — Z20822 Contact with and (suspected) exposure to covid-19: Secondary | ICD-10-CM | POA: Diagnosis not present

## 2019-12-31 DIAGNOSIS — Z20828 Contact with and (suspected) exposure to other viral communicable diseases: Secondary | ICD-10-CM | POA: Diagnosis not present

## 2020-01-03 DIAGNOSIS — R197 Diarrhea, unspecified: Secondary | ICD-10-CM | POA: Diagnosis not present

## 2020-01-03 DIAGNOSIS — U071 COVID-19: Secondary | ICD-10-CM | POA: Diagnosis not present

## 2020-01-03 DIAGNOSIS — Z743 Need for continuous supervision: Secondary | ICD-10-CM | POA: Diagnosis not present

## 2020-01-03 DIAGNOSIS — R55 Syncope and collapse: Secondary | ICD-10-CM | POA: Diagnosis not present

## 2020-01-03 DIAGNOSIS — Z20828 Contact with and (suspected) exposure to other viral communicable diseases: Secondary | ICD-10-CM | POA: Diagnosis not present

## 2020-01-03 DIAGNOSIS — E860 Dehydration: Secondary | ICD-10-CM | POA: Diagnosis not present

## 2020-10-03 IMAGING — NM NM HEPATO W/GB/PHARM/[PERSON_NAME]
2 series · 12 of 12 positions shown · non-contrast
Comparison: None.

CLINICAL DATA: Right upper quadrant pain with nausea

EXAM:
NUCLEAR MEDICINE HEPATOBILIARY IMAGING WITH GALLBLADDER EF
VIEWS:
Anterior, right lateral right upper quadrant
RADIOPHARMACEUTICALS:  5.2 mCi Gc-55m  Choletec IV

[he hepatobiliary · 4.52mm/px · 6 of 56 frames shown (1 of 2)]
[frame 5/56]
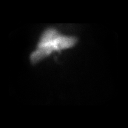
[frame 14/56]
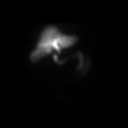
[frame 24/56]
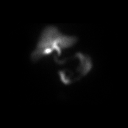
[frame 33/56]
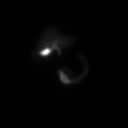
[frame 42/56]
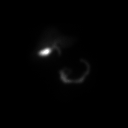
[frame 52/56]
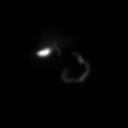

[he hepatobiliary · 4.52mm/px · 6 of 60 frames shown (2 of 2)]
[frame 6/60]
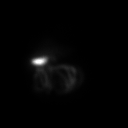
[frame 16/60]
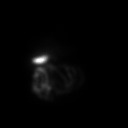
[frame 26/60]
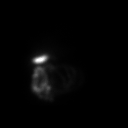
[frame 36/60]
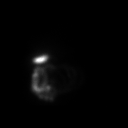
[frame 46/60]
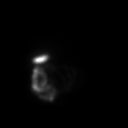
[frame 56/60]
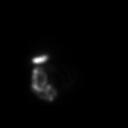

[12 of 12 positions shown; findings below may reference images not displayed]

FINDINGS: Liver uptake of radiotracer is unremarkable. There is prompt
visualization of gallbladder and small bowel, indicating patency of
the cystic and common bile ducts. The patient consumed 8 ounces of
Ensure orally with calculation of the computer generated ejection
fraction of radiotracer from the gallbladder. The patient
experienced nausea with the oral Ensure consumption. The computer
generated ejection fraction of radiotracer from the gallbladder is
abnormally low at 21%, normal greater than 33% using the oral agent.
IMPRESSION: Abnormally low ejection fraction of radiotracer from the
gallbladder, a finding most likely indicative of biliary dyskinesia.
The patient experienced nausea with the oral Ensure consumption.
Cystic and common bile ducts are patent as is evidenced by
visualization of gallbladder and small bowel.

## 2023-01-12 ENCOUNTER — Encounter (HOSPITAL_COMMUNITY): Payer: Self-pay

## 2023-01-12 ENCOUNTER — Ambulatory Visit (HOSPITAL_COMMUNITY)
Admission: EM | Admit: 2023-01-12 | Discharge: 2023-01-12 | Disposition: A | Discharge status: Home / Self Care | Payer: Medicare Other | Attending: Internal Medicine | Admitting: Internal Medicine

## 2023-01-12 DIAGNOSIS — J101 Influenza due to other identified influenza virus with other respiratory manifestations: Secondary | ICD-10-CM

## 2023-01-12 DIAGNOSIS — R509 Fever, unspecified: Secondary | ICD-10-CM | POA: Diagnosis not present

## 2023-01-12 LAB — POC COVID19/FLU A&B COMBO
Covid Antigen, POC: NEGATIVE
Influenza A Antigen, POC: POSITIVE — AB
Influenza B Antigen, POC: NEGATIVE

## 2023-01-12 MED ORDER — ONDANSETRON 4 MG PO TBDP: 4.0000 mg | ORAL_TABLET | Freq: Three times a day (TID) | ORAL | 0 refills | Status: AC | PRN

## 2023-01-12 MED ORDER — BENZONATATE 100 MG PO CAPS: 100.0000 mg | ORAL_CAPSULE | Freq: Three times a day (TID) | ORAL | 0 refills | Status: AC

## 2023-01-12 MED ORDER — HYDROCOD POLI-CHLORPHE POLI ER 10-8 MG/5ML PO SUER: 5.0000 mL | Freq: Two times a day (BID) | ORAL | 0 refills | Status: AC | PRN

## 2023-01-12 MED ORDER — ALBUTEROL SULFATE HFA 108 (90 BASE) MCG/ACT IN AERS
1.0000 | INHALATION_SPRAY | Freq: Four times a day (QID) | RESPIRATORY_TRACT | 0 refills | Status: AC | PRN
Start: 2023-01-12 — End: ?

## 2023-01-12 NOTE — ED Provider Notes (Signed)
 [facility]    CSN: [id number] Arrival date & time: 01/12/23  1534      History   Chief Complaint Chief Complaint  Patient presents with   Cough    HPI Jillian Jimenez is a 78 y.o. female.   78 year old female who presents to urgent care with complaints of fever, nausea, vomiting, headache, congestion and severe fatigue.  She reports that yesterday her symptoms started like a cold with a runny nose.  This then progressed to a sore throat and headache.  Later in the evening she felt like she had been hit by a truck with bodyaches, vomiting, nausea and severe fatigue.  She woke up this morning with worsening symptoms.  She took some Zofran  that she had which did help with the nausea.  She denies shortness of breath, chest pain, diarrhea, abdominal pain, dysuria, hematuria.  She has had a mild cough but this is not severe.  She was in the emergency room on December 27 secondary to a urinary tract infection and was sent home on Macrobid.  She reports that at that time she actually went to 2 different emergency room's because she had to wait for over 6 hours at 1 ER.  She could have been exposed at that point to any number of respiratory illnesses.  She is from the [place]  area and visiting family whom she has been around closely but none of them have been sick.     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/24/2016   Essential hypertension 03/24/2016   Hypothyroidism 03/24/2016   Dyspnea 07/14/2015    Past Surgical History:  Procedure Laterality Date   ABDOMINAL HYSTERECTOMY     BREAST CYST ASPIRATION     BUNIONECTOMY     x2   CESAREAN SECTION     x1   CHOLECYSTECTOMY N/A 02/16/2018   Procedure: LAPAROSCOPIC CHOLECYSTECTOMY WITH INTRAOPERATIVE CHOLANGIOGRAM;  Surgeon: Mikell Katz, MD;  Location: [facility];  Service: General;  Laterality: N/A;   cyst removed     ovarian, ankle and barthleon gland   LEFT HEART CATH AND CORONARY ANGIOGRAPHY N/A 10/03/2016   Procedure: LEFT HEART CATH AND CORONARY ANGIOGRAPHY;  Surgeon: Mady Bruckner, MD;  Location: [facility];  Service: Cardiovascular;  Laterality: N/A;    OB History   No obstetric history on file.      Home Medications    Prior to Admission medications   Medication Sig Start Date End  Date Taking? Authorizing Provider  acetaminophen  (TYLENOL ) 325 MG tablet Take 2 tablets (650 mg total) by mouth every 6 (six) hours as needed for mild pain (or Fever >/= 101). Patient not taking: Reported on 02/03/2018 03/28/16   Rizwan, Saima, MD  benazepril  (LOTENSIN ) 10 MG tablet Take 10 mg by mouth daily.    [provider]  cetirizine  (ZYRTEC ) 10 MG tablet Take 10 mg by mouth daily.    [provider]  lansoprazole (PREVACID) 15 MG capsule Take 15 mg by mouth at bedtime.     [provider]  levothyroxine  (SYNTHROID , LEVOTHROID) 25 MCG tablet Take 25 mcg by mouth daily before breakfast.    [provider]  ondansetron  (ZOFRAN ) 4 MG tablet Take 1 tablet (4 mg total) by mouth every 8 (eight) hours as needed for nausea or vomiting. 07/10/18    Tegeler, Lonni PARAS, MD  pioglitazone (ACTOS) 15 MG tablet Take 15 mg by mouth daily.    [provider]  Vitamin D, Ergocalciferol, (DRISDOL) 1.25 MG (50000 UT) CAPS capsule Take 50,000 Units by mouth once a week. On Wednesdays 06/11/18   [provider]  vitamin k 100 MCG tablet Take 100 mcg by mouth once a week. On wednesdays    [provider]    Family History Family History  Problem Relation Age of Onset   Breast cancer Sister 16   Breast cancer Maternal Grandmother    Stomach cancer Maternal Grandmother    Stomach cancer Mother    COPD Sister    Asthma Sister     Social History Social History   Tobacco Use   Smoking status: Never   Smokeless tobacco: Never  Substance Use Topics   Alcohol use: Yes    Alcohol/week: 1.0 standard drink of alcohol    Types: 1 Glasses of wine per week    Comment: every other day   Drug use: No     Allergies   Aspirin , Augmentin [amoxicillin-pot clavulanate], Clindamycin, Clopidogrel, Promethazine, Cephalexin, Latex, Norvasc [amlodipine], and Sulfa antibiotics   Review of Systems Review of Systems  Constitutional:  Positive for chills and fever.  HENT:  Positive for congestion and rhinorrhea. Negative for ear pain and sore throat.   Eyes:  Negative for pain and visual disturbance.  Respiratory:  Positive for cough (Mild). Negative for shortness of breath.   Cardiovascular:  Negative for chest pain and palpitations.  Gastrointestinal:  Positive for nausea and vomiting. Negative for abdominal pain.  Genitourinary:  Negative for dysuria and hematuria.  Musculoskeletal:  Negative for arthralgias and back pain.  Skin:  Negative for color change and rash.  Neurological:  Negative for seizures and syncope.  All other systems reviewed and are negative.    Physical Exam Triage Vital Signs ED Triage Vitals [01/12/23 1546]  Encounter Vitals Group     BP (!) 148/80     Systolic BP Percentile      Diastolic  BP Percentile      Pulse Rate (!) 109     Resp 16     Temp (!) 102.9 F (39.4 C)     Temp Source Oral     SpO2 91 %     Weight 195 lb (88.5 kg)     Height 4' 11 (1.499 m)     Head Circumference      Peak Flow      Pain Score 0     Pain Loc      Pain Education  Exclude from Growth Chart    No data found.  Updated Vital Signs BP (!) 148/80 (BP Location: Left Arm)   Pulse (!) 109   Temp (!) 102.9 F (39.4 C) (Oral)   Resp 16   Ht 4' 11 (1.499 m)   Wt 195 lb (88.5 kg)   SpO2 91%   BMI 39.39 kg/m   Visual Acuity Right Eye Distance:   Left Eye Distance:   Bilateral Distance:    Right Eye Near:   Left Eye Near:    Bilateral Near:     Physical Exam Vitals and nursing note reviewed.  Constitutional:      General: She is not in acute distress.    Appearance: She is well-developed.  HENT:     Head: Normocephalic and atraumatic.     Right Ear: Tympanic membrane normal.     Left Ear: Tympanic membrane normal.     Nose: Congestion present.     Mouth/Throat:     Mouth: Mucous membranes are moist.     Pharynx: No posterior oropharyngeal erythema.  Eyes:     Conjunctiva/sclera: Conjunctivae normal.  Cardiovascular:     Rate and Rhythm: Normal rate and regular rhythm.     Heart sounds: No murmur heard. Pulmonary:     Effort: Pulmonary effort is normal. No respiratory distress.     Breath sounds: Normal breath sounds. No decreased breath sounds, wheezing or rhonchi.  Abdominal:     Palpations: Abdomen is soft.     Tenderness: There is no abdominal tenderness.  Musculoskeletal:        General: No swelling.     Cervical back: Neck supple.  Skin:    General: Skin is warm and dry.     Capillary Refill: Capillary refill takes less than 2 seconds.  Neurological:     Mental Status: She is alert.  Psychiatric:        Mood and Affect: Mood normal.     UC Treatments / Results  Labs (all labs ordered are listed, but only abnormal results are displayed) Labs  Reviewed - No data to display  EKG   Radiology No results found.  Procedures Procedures (including critical care time)  Medications Ordered in UC Medications - No data to display  Initial Impression / Assessment and Plan / UC Course  I have reviewed the triage vital signs and the nursing notes.  Pertinent labs & imaging results that were available during my care of the patient were reviewed by me and considered in my medical decision making (see chart for details).     Influenza A  Fever and chills   COVID, flu A and flu B tested today.  Flu A is positive. This is a virus and antibiotics are not needed. We can treat with the following:  Rest and stay hydrated Tylenol  325 to 650 mg every 6 hours as needed for fevers or bodyaches. May use ibuprofen alternating for body aches.  Zofran  4 mg orally disintegrating tablets every 8 hours as needed for nausea or vomiting Tussionex 5 mLs every 12 hours as needed for cough. Use caution as this can make you sleepy.  Benzonatate  100 mg every 8 hours as needed for cough. Albuterol  inhaler 1-2 puffs every 6 hours as needed for shortness of breath/wheezing Try to avoid contact with others until no longer running a fever Return to urgent care or PCP if symptoms worsen or fail to resolve.    Final Clinical Impressions(s) / UC Diagnoses  Final diagnoses:  None   Discharge Instructions   None    ED Prescriptions   None    PDMP not reviewed this encounter.   Teresa Almarie LABOR, [place] 01/12/23 1654

## 2023-01-12 NOTE — Discharge Instructions (Addendum)
 COVID, flu A and flu B tested today.  Flu A is positive. This is a virus and antibiotics are not needed. We can treat with the following:  Rest and stay hydrated Tylenol  325 to 650 mg every 6 hours as needed for fevers or bodyaches. May use ibuprofen alternating for body aches.  Zofran  4 mg orally disintegrating tablets every 8 hours as needed for nausea or vomiting Tussionex 5 mLs every 12 hours as needed for cough. Use caution as this can make you sleepy.  Benzonatate  100 mg every 8 hours as needed for cough. Albuterol  inhaler 1-2 puffs every 6 hours as needed for shortness of breath/wheezing Try to avoid contact with others until no longer running a fever Return to urgent care or PCP if symptoms worsen or fail to resolve.

## 2023-01-12 NOTE — ED Triage Notes (Signed)
 Patient here today with c/o cough, fever, chills, sweats, body aches, and runny nose since yesterday.
